# Patient Record
Sex: Female | Born: 1960 | Hispanic: No | Marital: Single | Smoking: Never smoker
Health system: Southern US, Community
[De-identification: ages and names within clinical notes are randomized; demographics above are authoritative.]

## PROBLEM LIST (undated history)

## (undated) DIAGNOSIS — E119 Type 2 diabetes mellitus without complications: Secondary | ICD-10-CM

## (undated) DIAGNOSIS — E7800 Pure hypercholesterolemia, unspecified: Secondary | ICD-10-CM

## (undated) DIAGNOSIS — I10 Essential (primary) hypertension: Secondary | ICD-10-CM

---

## 2020-05-06 ENCOUNTER — Emergency Department
Admission: EM | Admit: 2020-05-06 | Discharge: 2020-05-06 | Disposition: A | Discharge status: Home / Self Care | Payer: Medicare Other | Attending: Emergency Medicine | Admitting: Emergency Medicine

## 2020-05-06 ENCOUNTER — Encounter: Payer: Self-pay | Admitting: Intensive Care

## 2020-05-06 ENCOUNTER — Other Ambulatory Visit: Payer: Self-pay

## 2020-05-06 ENCOUNTER — Emergency Department: Payer: Medicare Other

## 2020-05-06 DIAGNOSIS — H538 Other visual disturbances: Secondary | ICD-10-CM | POA: Diagnosis not present

## 2020-05-06 DIAGNOSIS — E119 Type 2 diabetes mellitus without complications: Secondary | ICD-10-CM | POA: Insufficient documentation

## 2020-05-06 DIAGNOSIS — I10 Essential (primary) hypertension: Secondary | ICD-10-CM | POA: Insufficient documentation

## 2020-05-06 DIAGNOSIS — H539 Unspecified visual disturbance: Secondary | ICD-10-CM

## 2020-05-06 HISTORY — DX: Essential (primary) hypertension: I10

## 2020-05-06 HISTORY — DX: Type 2 diabetes mellitus without complications: E11.9

## 2020-05-06 HISTORY — DX: Pure hypercholesterolemia, unspecified: E78.00

## 2020-05-06 LAB — GLUCOSE, CAPILLARY: Glucose-Capillary: 158 mg/dL — ABNORMAL HIGH (ref 70–99)

## 2020-05-06 NOTE — ED Notes (Signed)
Snack of graham crackers, peanut butter, and milk given

## 2020-05-06 NOTE — Discharge Instructions (Signed)
Your CT scan of the head today showed some old strokes but no serious issues.  Your exam was otherwise reassuring. Please call [facility] tomorrow to see if they can see you soon.  You can also follow up with neurology for continued monitoring of your health and prevention of future strokes.

## 2020-05-06 NOTE — ED Triage Notes (Signed)
Patient c/o black line in left eye impairing her vision since Friday 05/01/20. Eye doctor reported to patient he could not see her until 05/22/20. Came to ER because black line is still present. Reports being able to see but a challenge to focus. C/o redness to right eye

## 2020-05-06 NOTE — ED Provider Notes (Signed)
[facility] Emergency Department Provider Note  ____________________________________________  Time seen: Approximately 8:31 PM  I have reviewed the triage vital signs and the nursing notes.   HISTORY  Chief Complaint Visual disturbance   HPI Anna Nichols is a 59 y.o. female with a history of hypertension hyperlipidemia diabetes and prior strokes comes the ED complaining of a visual disturbance in her left eye for the past 5 days.  It is constant, appears that there is a thin line in the middle of her visual field.  She was convinced that she had an eyelash in her hair and so she flushed her eye copiously but it did not resolve it.  Denies any recent trauma or significant headache fevers chills or neck pain.  No weakness paresthesias or change in balance or coordination.  She called her PCP who referred her to [facility] ophthalmology who told her they could not see her until October 22.  She went to [facility] walk-in who sent her to the ED for further evaluation.  Denies any complaints whatsoever except for the visual disturbance.      Past Medical History:  Diagnosis Date  . Diabetes mellitus without complication (HCC)   . High cholesterol   . Hypertension      There are no problems to display for this patient.    History reviewed. No pertinent surgical history.   Prior to Admission medications   Not on File     Allergies Ace inhibitors, Caffeine, and Codeine   History reviewed. No pertinent family history.  Social History Social History   Tobacco Use  . Smoking status: Never Smoker  . Smokeless tobacco: Never Used  Substance Use Topics  . Alcohol use: Never  . Drug use: Never    Review of Systems  Constitutional:   No fever or chills.  ENT:   No sore throat. No rhinorrhea. Cardiovascular:   No chest pain or syncope. Respiratory:   No dyspnea or cough. Gastrointestinal:   Negative for abdominal pain, vomiting and diarrhea.   Musculoskeletal:   Negative for focal pain or swelling All other systems reviewed and are negative except as documented above in ROS and HPI.  ____________________________________________   PHYSICAL EXAM:  VITAL SIGNS: ED Triage Vitals  Enc Vitals Group     BP 05/06/20 1746 (!) 150/79     Pulse Rate 05/06/20 1746 71     Resp 05/06/20 2024 16     Temp 05/06/20 1746 98.4 F (36.9 C)     Temp Source 05/06/20 1746 Oral     SpO2 05/06/20 1746 98 %     Weight 05/06/20 1747 118 lb (53.5 kg)     Height 05/06/20 1747 4\' 7"  (1.397 m)     Head Circumference --      Peak Flow --      Pain Score 05/06/20 1746 2     Pain Loc --      Pain Edu? --      Excl. in GC? --     Vital signs reviewed, nursing assessments reviewed.   Constitutional:   Alert and oriented. Non-toxic appearance. Eyes:   Conjunctivae are normal. EOMI. PERRL.  No nystagmus.  Funduscopy normal bilaterally, sharp disks, no hemorrhage or pallor ENT      Head:   Normocephalic and atraumatic.      Nose:   Normal.      Mouth/Throat:   Normal, moist mucosa      Neck:   No meningismus. Full  ROM. Hematological/Lymphatic/Immunilogical:   No cervical lymphadenopathy. Cardiovascular:   RRR. Symmetric bilateral radial and DP pulses.  No murmurs. Cap refill less than 2 seconds. Respiratory:   Normal respiratory effort without tachypnea/retractions. Breath sounds are clear and equal bilaterally. No wheezes/rales/rhonchi. Gastrointestinal:   Soft and nontender. Non distended. There is no CVA tenderness.  No rebound, rigidity, or guarding.  Musculoskeletal:   Normal range of motion in all extremities. No joint effusions.  No lower extremity tenderness.  No edema. Neurologic:   Normal speech and language.  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 Normal gait, no extremity drift.  Normal finger-to-nose bilaterally.  Normal coordination of both sides together. No acute focal neurologic deficits are  appreciated.  Skin:    Skin is warm, dry and intact. No rash noted.  No petechiae, purpura, or bullae.  ____________________________________________    LABS (pertinent positives/negatives) (all labs ordered are listed, but only abnormal results are displayed) Labs Reviewed  GLUCOSE, CAPILLARY - Abnormal; Notable for the following components:      Result Value   Glucose-Capillary 158 (*)    All other components within normal limits  CBG MONITORING, ED   ____________________________________________   EKG    ____________________________________________    RADIOLOGY  CT Head Wo Contrast  Result Date: 05/06/2020 CLINICAL DATA:  Vision loss black line in left eye impairing the vision EXAM: CT HEAD WITHOUT CONTRAST CT MAXILLOFACIAL WITHOUT CONTRAST TECHNIQUE: Multidetector CT imaging of the head and maxillofacial structures were performed using the standard protocol without intravenous contrast. Multiplanar CT image reconstructions of the maxillofacial structures were also generated. COMPARISON:  None. FINDINGS: CT HEAD FINDINGS Brain: No intracranial mass or hemorrhage is visualized. Wedge-shaped hypodensity in the left cerebellum, series 3 image 7 through 10. No midline shift. Nonenlarged ventricles. Vascular: No hyperdense vessels.  Carotid vascular calcification Skull: Normal. Negative for fracture or focal lesion. Other: None CT MAXILLOFACIAL FINDINGS Osseous: No fracture or mandibular dislocation. No destructive process. Orbits: Negative. No traumatic or inflammatory finding. Sinuses: Mild mucosal thickening in the maxillary and ethmoid sinuses. Soft tissues: Negative. IMPRESSION: 1. Wedge-shaped hypodensity in the left cerebellum, suspicious for acute to subacute infarct. Negative for hemorrhage or significant mass effect. 2. No CT evidence for acute orbital abnormality. Electronically Signed   By: Jasmine Pang M.D.   On: 05/06/2020 18:53   CT Orbits Wo Contrast  Result Date:  05/06/2020 CLINICAL DATA:  Vision loss black line in left eye impairing the vision EXAM: CT HEAD WITHOUT CONTRAST CT MAXILLOFACIAL WITHOUT CONTRAST TECHNIQUE: Multidetector CT imaging of the head and maxillofacial structures were performed using the standard protocol without intravenous contrast. Multiplanar CT image reconstructions of the maxillofacial structures were also generated. COMPARISON:  None. FINDINGS: CT HEAD FINDINGS Brain: No intracranial mass or hemorrhage is visualized. Wedge-shaped hypodensity in the left cerebellum, series 3 image 7 through 10. No midline shift. Nonenlarged ventricles. Vascular: No hyperdense vessels.  Carotid vascular calcification Skull: Normal. Negative for fracture or focal lesion. Other: None CT MAXILLOFACIAL FINDINGS Osseous: No fracture or mandibular dislocation. No destructive process. Orbits: Negative. No traumatic or inflammatory finding. Sinuses: Mild mucosal thickening in the maxillary and ethmoid sinuses. Soft tissues: Negative. IMPRESSION: 1. Wedge-shaped hypodensity in the left cerebellum, suspicious for acute to subacute infarct. Negative for hemorrhage or significant mass effect. 2. No CT evidence for acute orbital abnormality. Electronically Signed   By: Jasmine Pang M.D.   On: 05/06/2020 18:53    ____________________________________________   PROCEDURES Procedures  ____________________________________________  DIFFERENTIAL DIAGNOSIS   Intracranial hemorrhage, acute ischemic stroke, retinal detachment, photopsia  CLINICAL IMPRESSION / ASSESSMENT AND PLAN / ED COURSE  Medications ordered in the ED: Medications - No data to display  Pertinent labs & imaging results that were available during my care of the patient were reviewed by me and considered in my medical decision making (see chart for details).  Anna Nichols was evaluated in Emergency Department on 05/06/2020 for the symptoms described in the history of present illness. She was  evaluated in the context of the global COVID-19 pandemic, which necessitated consideration that the patient might be at risk for infection with the SARS-CoV-2 virus that causes COVID-19. Institutional protocols and algorithms that pertain to the evaluation of patients at risk for COVID-19 are in a state of rapid change based on information released by regulatory bodies including the CDC and federal and state organizations. These policies and algorithms were followed during the patient's care in the ED.   Patient presents with a visual disturbance.  Vital signs and exam are unremarkable.  She does not have any other symptoms to suggest an ischemic stroke.  CT of the head and orbits were obtained here which do show an area of apparent infarct in the left cerebellum.  However, comparing to records from [facility] in care everywhere, this infarct has been present for many years.  It is not acute, and based on her lack of symptoms I would doubt a an acute cerebellar infarct.  I will refer her to [facility] who most likely can see her much sooner.  Recommend she follow-up with neurology as well for continued optimization of medical management and stroke prevention     ____________________________________________   FINAL CLINICAL IMPRESSION(S) / ED DIAGNOSES    Final diagnoses:  Visual disturbance     ED Discharge Orders    None      Portions of this note were generated with dragon dictation software. Dictation errors may occur despite best attempts at proofreading.   Sharman Cheek, MD 05/06/20 2035

## 2020-06-01 ENCOUNTER — Other Ambulatory Visit: Payer: Self-pay | Admitting: Acute Care

## 2020-06-01 DIAGNOSIS — Z8673 Personal history of transient ischemic attack (TIA), and cerebral infarction without residual deficits: Secondary | ICD-10-CM

## 2020-06-11 ENCOUNTER — Other Ambulatory Visit: Payer: Self-pay

## 2020-06-11 ENCOUNTER — Ambulatory Visit
Admission: RE | Admit: 2020-06-11 | Discharge: 2020-06-11 | Disposition: A | Discharge status: Home / Self Care | Payer: Medicare Other | Source: Ambulatory Visit | Attending: Acute Care | Admitting: Acute Care

## 2020-06-11 DIAGNOSIS — Z8673 Personal history of transient ischemic attack (TIA), and cerebral infarction without residual deficits: Secondary | ICD-10-CM

## 2020-07-17 ENCOUNTER — Ambulatory Visit: Payer: Medicare Other | Attending: Neurology

## 2020-07-17 ENCOUNTER — Other Ambulatory Visit: Payer: Self-pay

## 2020-07-17 DIAGNOSIS — R262 Difficulty in walking, not elsewhere classified: Secondary | ICD-10-CM

## 2020-07-17 DIAGNOSIS — R2681 Unsteadiness on feet: Secondary | ICD-10-CM | POA: Insufficient documentation

## 2020-07-17 DIAGNOSIS — M6281 Muscle weakness (generalized): Secondary | ICD-10-CM | POA: Insufficient documentation
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
## 2020-07-21 ENCOUNTER — Ambulatory Visit: Payer: Medicare Other

## 2020-07-21 ENCOUNTER — Other Ambulatory Visit: Payer: Self-pay

## 2020-07-21 VITALS — BP 145/74 | HR 91

## 2020-07-21 DIAGNOSIS — M6281 Muscle weakness (generalized): Secondary | ICD-10-CM | POA: Diagnosis not present

## 2020-07-21 DIAGNOSIS — R262 Difficulty in walking, not elsewhere classified: Secondary | ICD-10-CM
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
## 2020-07-29 ENCOUNTER — Ambulatory Visit: Payer: Medicare Other

## 2020-07-29 VITALS — BP 124/75 | HR 81

## 2020-07-29 DIAGNOSIS — R2681 Unsteadiness on feet: Secondary | ICD-10-CM

## 2020-07-29 DIAGNOSIS — M6281 Muscle weakness (generalized): Secondary | ICD-10-CM | POA: Diagnosis not present

## 2020-07-29 DIAGNOSIS — R262 Difficulty in walking, not elsewhere classified: Secondary | ICD-10-CM

## 2020-07-29 NOTE — Patient Instructions (Signed)
Access Code: [email or web address] URL: [email or web address] Date: 07/29/2020 Prepared by: Ria Comment  Exercises Sit to Stand without Arm Support - 1 x daily - 7 x weekly - 2 sets - 10 reps Seated March - 1 x daily - 7 x weekly - 2 sets - 10 reps - 3s hold Seated Hip Adduction Isometrics with Ball - 1 x daily - 7 x weekly - 2 sets - 10 reps - 3s hold Standing Tandem Balance with Counter Support - 1 x daily - 7 x weekly - 30s x 3 with each forward hold
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
## 2020-08-03 ENCOUNTER — Ambulatory Visit: Payer: Medicare Other

## 2020-08-03 NOTE — Patient Instructions (Incomplete)
TREATMENT    Ther-ex  Hooklying LE marches x 10 BLE;  Supine L SLR x 10; Supine L straight leg hip abduction/adduction with manual resistance from therapist x 10 each; Left heel slides with manually resisted extension x 10; Hooklying clams with manual resistance from therapist x 10 BLE; Hooklying adductor squeeze with manual resistance from therapist x 10 BLE; Hooklying bridges, RLE slightly extended to bias LLE x 10; Seated marches with manual resistance from therapist x 10 BLE; Seated L LAQ with manual resistance form therapist x 10; Sit to stand from low mat table with RLE on 6" step to bias LLE, no UE assist 2 x 10;   Neuromuscular Re-education  All balance exercises performed without upper extremity support unless otherwise indicated; Alternating 6" step taps x10 BLE;  Alternating Airex 6" step taps x10 BLE; 6" step ups leading with LLE up and RLE down x10; Hurdle forward steps alternating lower extremity x10 each; Tandem gait in parallel bars 10 feet x 4 lengths; HEP issued with education about how to perform safely at home;   Pt educated throughout session about proper posture and technique with exercises. Improved exercise technique, movement at target joints, use of target muscles after min to mod verbal, visual, tactile cues.    Patient demonstrates excellent motivation during session today.  Progressed strengthening as well as balance activities.  Initiated HEP with education about how to perform safely at home.  Patient encouraged to follow-up as scheduled. Pt will benefit from PT services to address deficits in strength, balance, and mobility in order to return to full function at home.

## 2020-08-06 ENCOUNTER — Ambulatory Visit: Payer: Medicare Other | Attending: Neurology

## 2020-08-06 ENCOUNTER — Other Ambulatory Visit: Payer: Self-pay

## 2020-08-06 DIAGNOSIS — M6281 Muscle weakness (generalized): Secondary | ICD-10-CM | POA: Insufficient documentation

## 2020-08-06 DIAGNOSIS — R2681 Unsteadiness on feet: Secondary | ICD-10-CM | POA: Diagnosis present

## 2020-08-06 DIAGNOSIS — R262 Difficulty in walking, not elsewhere classified: Secondary | ICD-10-CM | POA: Diagnosis present
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
## 2020-08-11 ENCOUNTER — Other Ambulatory Visit: Payer: Self-pay

## 2020-08-11 ENCOUNTER — Ambulatory Visit: Payer: Medicare Other

## 2020-08-11 DIAGNOSIS — R2681 Unsteadiness on feet: Secondary | ICD-10-CM

## 2020-08-11 DIAGNOSIS — M6281 Muscle weakness (generalized): Secondary | ICD-10-CM

## 2020-08-11 DIAGNOSIS — R262 Difficulty in walking, not elsewhere classified: Secondary | ICD-10-CM
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
## 2020-08-13 ENCOUNTER — Other Ambulatory Visit: Payer: Self-pay

## 2020-08-13 ENCOUNTER — Ambulatory Visit: Payer: Medicare Other

## 2020-08-13 DIAGNOSIS — M6281 Muscle weakness (generalized): Secondary | ICD-10-CM | POA: Diagnosis not present

## 2020-08-13 DIAGNOSIS — R262 Difficulty in walking, not elsewhere classified: Secondary | ICD-10-CM

## 2020-08-13 DIAGNOSIS — R2681 Unsteadiness on feet: Secondary | ICD-10-CM
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
## 2020-08-18 ENCOUNTER — Other Ambulatory Visit: Payer: Self-pay

## 2020-08-18 ENCOUNTER — Ambulatory Visit: Payer: Medicare Other

## 2020-08-18 DIAGNOSIS — M6281 Muscle weakness (generalized): Secondary | ICD-10-CM | POA: Diagnosis not present

## 2020-08-18 DIAGNOSIS — R262 Difficulty in walking, not elsewhere classified: Secondary | ICD-10-CM
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
## 2020-08-19 NOTE — Patient Instructions (Incomplete)
TREATMENT      Ther-ex  NuStep L1 x 5 minutes for warm-up during history (5 minutes unbilled); Supine L SLR x 16; Supine L straight leg hip abduction/adduction with manual resistance from therapist x 10 each; Supine left ankle dorsiflexion, plantarflexion, inversion, and eversion  x15 each; Supine left short arc quads over bolster 2x10; Left heel slides 2 x 10; Hooklying adductor squeeze with ball 3sec hold  x 10 BLE; Hooklying bridges, RLE slightly extended to bias LLE 2 x 10; Seated marches with manual resistance from therapist x 10 BLE;     Manual Therapy  MHP applied to L shoulder x 5 minutes during interval history; Gentle left shoulder passive range of motion into flexion, abduction, and external rotation; L shoulder GH A/P mobilizations at neutral grade I-II, 20s/bout x 3 bouts; L shoulder GH inferior mobilizations at 80 abduction grade I-II, 20s/bout x 2 bouts; L shoulder distraction mobilizations gradually progressing through partial abduction PROM; "Floppy fish" L shoulder rhythmic relaxation x 60s;  Ice pack applied to left shoulder x10 minutes during left lower extremity exercises;    Pt will benefit from PT services to address deficits in strength, balance, and mobility in order to return to full function at home   Patient demonstrates excellent motivation during session today.   Initiated light passive range of motion of left shoulder as well as passive accessory mobilizations for pain modulation and improved capsular mobility. Continued LLE strengthening exercises during session as well.  No HEP modifications today. Patient encouraged to follow-up as scheduled. Pt will benefit from PT services to address deficits in strength, balance, and mobility in order to return to full function at home.

## 2020-08-20 ENCOUNTER — Ambulatory Visit: Payer: Medicare Other

## 2020-08-20 ENCOUNTER — Other Ambulatory Visit: Payer: Self-pay

## 2020-08-20 DIAGNOSIS — M6281 Muscle weakness (generalized): Secondary | ICD-10-CM | POA: Diagnosis not present

## 2020-08-20 DIAGNOSIS — R262 Difficulty in walking, not elsewhere classified: Secondary | ICD-10-CM
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
## 2020-08-25 ENCOUNTER — Ambulatory Visit: Payer: Medicare Other

## 2020-08-25 ENCOUNTER — Other Ambulatory Visit: Payer: Self-pay

## 2020-08-25 DIAGNOSIS — M6281 Muscle weakness (generalized): Secondary | ICD-10-CM

## 2020-08-25 DIAGNOSIS — R262 Difficulty in walking, not elsewhere classified: Secondary | ICD-10-CM
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
## 2020-08-27 ENCOUNTER — Ambulatory Visit: Payer: Medicare Other

## 2020-08-28 ENCOUNTER — Ambulatory Visit: Payer: Medicare Other

## 2020-08-28 ENCOUNTER — Other Ambulatory Visit: Payer: Self-pay

## 2020-08-28 VITALS — BP 141/82 | HR 93

## 2020-08-28 DIAGNOSIS — M6281 Muscle weakness (generalized): Secondary | ICD-10-CM | POA: Diagnosis not present

## 2020-08-28 DIAGNOSIS — R2681 Unsteadiness on feet: Secondary | ICD-10-CM

## 2020-08-28 DIAGNOSIS — R262 Difficulty in walking, not elsewhere classified: Secondary | ICD-10-CM
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
## 2020-09-01 ENCOUNTER — Other Ambulatory Visit: Payer: Self-pay

## 2020-09-01 ENCOUNTER — Ambulatory Visit: Payer: Medicare Other | Attending: Neurology

## 2020-09-01 DIAGNOSIS — R2681 Unsteadiness on feet: Secondary | ICD-10-CM | POA: Diagnosis present

## 2020-09-01 DIAGNOSIS — M25612 Stiffness of left shoulder, not elsewhere classified: Secondary | ICD-10-CM | POA: Insufficient documentation

## 2020-09-01 DIAGNOSIS — M6281 Muscle weakness (generalized): Secondary | ICD-10-CM | POA: Insufficient documentation

## 2020-09-01 DIAGNOSIS — R262 Difficulty in walking, not elsewhere classified: Secondary | ICD-10-CM | POA: Diagnosis present
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
## 2020-09-03 ENCOUNTER — Ambulatory Visit: Payer: Medicare Other

## 2020-09-03 ENCOUNTER — Other Ambulatory Visit: Payer: Self-pay

## 2020-09-03 DIAGNOSIS — M6281 Muscle weakness (generalized): Secondary | ICD-10-CM | POA: Diagnosis not present

## 2020-09-03 DIAGNOSIS — R2681 Unsteadiness on feet: Secondary | ICD-10-CM

## 2020-09-03 DIAGNOSIS — R262 Difficulty in walking, not elsewhere classified: Secondary | ICD-10-CM
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
## 2020-09-04 ENCOUNTER — Ambulatory Visit: Payer: Medicare Other

## 2020-09-07 ENCOUNTER — Ambulatory Visit: Payer: Medicare Other

## 2020-09-08 ENCOUNTER — Ambulatory Visit: Payer: Medicare Other

## 2020-09-08 ENCOUNTER — Other Ambulatory Visit: Payer: Self-pay

## 2020-09-08 DIAGNOSIS — R262 Difficulty in walking, not elsewhere classified: Secondary | ICD-10-CM

## 2020-09-08 DIAGNOSIS — M6281 Muscle weakness (generalized): Secondary | ICD-10-CM | POA: Diagnosis not present

## 2020-09-08 DIAGNOSIS — M25612 Stiffness of left shoulder, not elsewhere classified: Secondary | ICD-10-CM
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
## 2020-09-10 ENCOUNTER — Other Ambulatory Visit: Payer: Self-pay

## 2020-09-10 ENCOUNTER — Ambulatory Visit: Payer: Medicare Other

## 2020-09-10 DIAGNOSIS — M25612 Stiffness of left shoulder, not elsewhere classified: Secondary | ICD-10-CM

## 2020-09-10 DIAGNOSIS — M6281 Muscle weakness (generalized): Secondary | ICD-10-CM

## 2020-09-10 DIAGNOSIS — R2681 Unsteadiness on feet: Secondary | ICD-10-CM

## 2020-09-10 DIAGNOSIS — R262 Difficulty in walking, not elsewhere classified: Secondary | ICD-10-CM
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
## 2020-09-15 ENCOUNTER — Other Ambulatory Visit: Payer: Self-pay

## 2020-09-15 ENCOUNTER — Ambulatory Visit: Payer: Medicare Other

## 2020-09-15 DIAGNOSIS — M25612 Stiffness of left shoulder, not elsewhere classified: Secondary | ICD-10-CM

## 2020-09-15 DIAGNOSIS — M6281 Muscle weakness (generalized): Secondary | ICD-10-CM | POA: Diagnosis not present

## 2020-09-15 DIAGNOSIS — R2681 Unsteadiness on feet: Secondary | ICD-10-CM

## 2020-09-15 DIAGNOSIS — R262 Difficulty in walking, not elsewhere classified: Secondary | ICD-10-CM
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
## 2020-09-17 ENCOUNTER — Other Ambulatory Visit: Payer: Self-pay

## 2020-09-17 ENCOUNTER — Ambulatory Visit: Payer: Medicare Other | Admitting: Physical Therapy

## 2020-09-17 DIAGNOSIS — R262 Difficulty in walking, not elsewhere classified: Secondary | ICD-10-CM

## 2020-09-17 DIAGNOSIS — M6281 Muscle weakness (generalized): Secondary | ICD-10-CM

## 2020-09-17 DIAGNOSIS — R2681 Unsteadiness on feet: Secondary | ICD-10-CM

## 2020-09-17 DIAGNOSIS — M25612 Stiffness of left shoulder, not elsewhere classified: Secondary | ICD-10-CM
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
## 2020-09-22 ENCOUNTER — Other Ambulatory Visit: Payer: Self-pay

## 2020-09-22 ENCOUNTER — Ambulatory Visit: Payer: Medicare Other

## 2020-09-22 DIAGNOSIS — R262 Difficulty in walking, not elsewhere classified: Secondary | ICD-10-CM

## 2020-09-22 DIAGNOSIS — R2681 Unsteadiness on feet: Secondary | ICD-10-CM

## 2020-09-22 DIAGNOSIS — M25612 Stiffness of left shoulder, not elsewhere classified: Secondary | ICD-10-CM

## 2020-09-22 DIAGNOSIS — M6281 Muscle weakness (generalized): Secondary | ICD-10-CM
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
## 2020-09-24 ENCOUNTER — Ambulatory Visit: Payer: Medicare Other

## 2020-09-24 ENCOUNTER — Other Ambulatory Visit: Payer: Self-pay

## 2020-09-24 DIAGNOSIS — M25612 Stiffness of left shoulder, not elsewhere classified: Secondary | ICD-10-CM

## 2020-09-24 DIAGNOSIS — M6281 Muscle weakness (generalized): Secondary | ICD-10-CM

## 2020-09-24 DIAGNOSIS — R262 Difficulty in walking, not elsewhere classified: Secondary | ICD-10-CM

## 2020-09-24 DIAGNOSIS — R2681 Unsteadiness on feet: Secondary | ICD-10-CM
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
## 2020-09-29 ENCOUNTER — Ambulatory Visit: Payer: Medicare Other | Attending: Neurology

## 2020-09-29 ENCOUNTER — Other Ambulatory Visit: Payer: Self-pay

## 2020-09-29 DIAGNOSIS — M6281 Muscle weakness (generalized): Secondary | ICD-10-CM

## 2020-09-29 DIAGNOSIS — M25612 Stiffness of left shoulder, not elsewhere classified: Secondary | ICD-10-CM | POA: Diagnosis present

## 2020-09-29 DIAGNOSIS — R262 Difficulty in walking, not elsewhere classified: Secondary | ICD-10-CM | POA: Diagnosis present

## 2020-09-29 DIAGNOSIS — R2681 Unsteadiness on feet: Secondary | ICD-10-CM | POA: Diagnosis present
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
## 2020-10-01 ENCOUNTER — Other Ambulatory Visit: Payer: Self-pay

## 2020-10-01 ENCOUNTER — Ambulatory Visit: Payer: Medicare Other

## 2020-10-01 DIAGNOSIS — M25612 Stiffness of left shoulder, not elsewhere classified: Secondary | ICD-10-CM

## 2020-10-01 DIAGNOSIS — M6281 Muscle weakness (generalized): Secondary | ICD-10-CM

## 2020-10-01 DIAGNOSIS — R262 Difficulty in walking, not elsewhere classified: Secondary | ICD-10-CM

## 2020-10-01 DIAGNOSIS — R2681 Unsteadiness on feet: Secondary | ICD-10-CM
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
## 2020-10-05 ENCOUNTER — Ambulatory Visit: Payer: Medicare Other

## 2020-10-05 ENCOUNTER — Other Ambulatory Visit: Payer: Self-pay

## 2020-10-05 DIAGNOSIS — R2681 Unsteadiness on feet: Secondary | ICD-10-CM

## 2020-10-05 DIAGNOSIS — M6281 Muscle weakness (generalized): Secondary | ICD-10-CM | POA: Diagnosis not present
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
## 2020-10-08 NOTE — Patient Instructions (Incomplete)
Re certification, no need to update goals  TREATMENT   Ther-ex NuStep L0-1 x 5 minutes for warm-up during history (4 minutes unbilled); Supine L straight leg hip abduction/adduction with manual resistance from therapist x 10 each; Left heel slideswith resisted extensionx 20; Hooklying L single leg bridge2 x 10; Hooklying clams with manual resistance from therapist x 20; Hooklying adductor squeezes with manual resistance from therapist x 20; Sit to stand without UE support from mat table with RLE on 6" step 2 x 10; Standing squats with butt tap on table x 10, verbal assist by therapist to keep weight evenly distributed between R and L feet; 6" forward step-up leading with LLE up and RLE down x 10; 6" L lateral step-up leading with LLE up and RLE down x 10;   Manual Therapy Gentle left shoulder passive range of motion into flexion, abduction, and external rotation; L shoulder GH A/P mobilizations at neutral grade I-II, 20s/bout x 3 bouts; L shoulder GH inferior mobilizations at 90 abduction grade I-II, 20s/bout x 2 bouts; L shoulder distraction mobilizations gradually progressing through partial abduction PROM; L shoulder posterior and inferior mobilizations at available end range flexion, grade I-II, 20s/bout x 3 bouts;   Pt educated throughout session about proper posture and technique with exercises. Improved exercise technique, movement at target joints, use of target muscles after min to mod verbal, visual, tactile cues.    Patient demonstrates excellent motivation during session today. Continued withlight passive range of motion of left shoulder as well as passive accessory mobilizations for pain modulation and improvedcapsular mobility. She reports that her left upper extremity numbness has gotten significantly better but she does report some burning in the left lateral part of her upper arm.. Continued LLEstrengtheningexercises during sessionas well.  NoHEPmodifications today.Patient encouraged to follow-up as scheduled. Pt will benefit from PT services to address deficits in strength, balance, and mobility in order to return to full function at home.

## 2020-10-09 ENCOUNTER — Other Ambulatory Visit: Payer: Self-pay

## 2020-10-09 ENCOUNTER — Ambulatory Visit: Payer: Medicare Other

## 2020-10-09 DIAGNOSIS — R2681 Unsteadiness on feet: Secondary | ICD-10-CM

## 2020-10-09 DIAGNOSIS — M6281 Muscle weakness (generalized): Secondary | ICD-10-CM
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
## 2020-10-13 ENCOUNTER — Ambulatory Visit: Payer: Medicare Other

## 2020-10-21 NOTE — Patient Instructions (Incomplete)
TREATMENT   Ther-ex NuStep L0-1 x 5 minutes for warm-up during history (4 minutes unbilled); Supine L straight leg hip abduction/adduction with manual resistance from therapist x 20; Left heel slideswith resisted extensionx 20; Hooklying L single leg bridge2x 10; Hooklying clams with manual resistance from therapist x 20; Hooklying adductor squeezes with manual resistance from therapist x 20; Reviewed mobility concerns with patient for international travel;   Manual Therapy Gentle left shoulder passive range of motion into flexion, abduction, and external rotation; L shoulder GH A/P mobilizations at neutral grade I-II, 20s/bout x 3 bouts; L shoulder GH inferior mobilizations at 90 abduction grade I-II, 20s/bout x 2 bouts; L shoulder distraction mobilizations gradually progressing through partial abduction PROM; L shoulder posterior and inferior mobilizations at available end range flexion, grade I-II, 20s/bout x 3 bouts;   Pt educated throughout session about proper posture and technique with exercises. Improved exercise technique, movement at target joints, use of target muscles after min to mod verbal, visual, tactile cues.   Pt arrived today with excellent motivation to participate in PT. Goals and outcome measures were updated recently so they did not need to be repeated today. During the last update the pt did very well with the 5TSTS demonstrating a clinically significant improvement. Additionally, the pt demonstrated self perceived improvement as seen in her ABC and QuickDash outcome measures. She continues to struggle with shoulder flexion and abduction on the L side when compared to full range on the R. Additionally, pt can still make improvements in her with continued PT services. Today's session focused on L shoulder manual therapy as well as LLE strengthening. Overall, the pt has excellent motivation to continue making progress during further treatment. Pt  will benefit from PT services to address deficits in strength, balance, and mobility in order to return to full function at home

## 2020-10-22 ENCOUNTER — Other Ambulatory Visit: Payer: Self-pay

## 2020-10-22 ENCOUNTER — Ambulatory Visit: Payer: Medicare Other

## 2020-10-22 DIAGNOSIS — M6281 Muscle weakness (generalized): Secondary | ICD-10-CM

## 2020-10-22 DIAGNOSIS — R2681 Unsteadiness on feet: Secondary | ICD-10-CM
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
## 2020-10-26 NOTE — Patient Instructions (Incomplete)
TREATMENT   Ther-ex NuStep L0-1 x 5 minutes for warm-up during history (4 minutes unbilled); Supine L straight leg hip abduction/adduction with manual resistance from therapist x20; Left heel slideswith resisted extensionx 20; Hooklying double leg bridge 2 x 10; Hooklying L single leg bridge2x 10; Hooklying clams with manual resistance from therapist x 20; Hooklying adductor squeezes with manual resistance from therapist x 20; L shoulder serratus punch manual resistance from therapist x10; L shoulder supine flexion with light manual resistance from therapist x10; L rhythmic stabilization x30 seconds;   Manual Therapy Gentle left shoulder passive range of motion into flexion, abduction, and external rotation; L shoulder GH A/P mobilizations at neutral grade I-II, 20s/bout x 3 bouts; L shoulder GH inferior mobilizations at 90 abduction grade I-II, 20s/bout x 2 bouts; L shoulder distraction mobilizations gradually progressing through partial abduction PROM; L shoulder posterior and inferior mobilizations at available end range flexion, grade I-II, 20s/bout x 3 bouts; STM to L anterior, lateral, and posterior shoulder with patient in supine utilizing effleurage and petrissage techniques to decrease pain;   Pt educated throughout session about proper posture and technique with exercises. Improved exercise technique, movement at target joints, use of target muscles after min to mod verbal, visual, tactile cues.   Pt arrived today with excellent motivation to participate in PT.  Some additional time spent on left shoulder manual techniques and strengthening due to her reported increase in pain since her last therapy session.  Also continued with left lower extremity strengthening today.  Patient reports decrease in her left shoulder pain with motion at the end of the session.  Pt will benefit from PT services to address deficits in strength, balance, and mobility in order to  return to full function at home.

## 2020-10-27 ENCOUNTER — Other Ambulatory Visit: Payer: Self-pay

## 2020-10-27 ENCOUNTER — Ambulatory Visit: Payer: Medicare Other

## 2020-10-27 DIAGNOSIS — M6281 Muscle weakness (generalized): Secondary | ICD-10-CM | POA: Diagnosis not present

## 2020-10-27 DIAGNOSIS — R2681 Unsteadiness on feet: Secondary | ICD-10-CM
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
## 2020-10-29 NOTE — Patient Instructions (Incomplete)
TREATMENT   Ther-ex NuStep L0-1 x 5 minutes for warm-up during history (4 minutes unbilled); Supine L straight leg hip abduction/adduction with manual resistance from therapist x20; Left heel slideswith resisted extensionx 20; Hooklying double leg bridge x 20; Hooklying L single leg bridgex 20; Hooklying clams with manual resistance from therapist x 20; Hooklying adductor squeezes with manual resistance from therapist x 20; Forward 6 inch step ups leading with left lower extremity x10; Left lateral 6 inch step ups leading with left lower extremity x10; Stands without upper extremity support with right lower extremity elevated on 6 inch step x10;   Manual Therapy Gentle left shoulder passive range of motion into flexion, abduction, and external rotation; L shoulder GH A/P mobilizations at neutral grade I-II, 20s/bout x 3 bouts; L shoulder GH inferior mobilizations at 90 abduction grade I-II, 20s/bout x 2 bouts; L shoulder distraction mobilizations gradually progressing through partial abduction PROM; L shoulder posterior and inferior mobilizations at available end range flexion, grade I-II, 20s/bout x 3 bouts; STM to L anterior, lateral, and posterior shoulder with patient in supine utilizing petrissage techniques to decrease pain;   Pt educated throughout session about proper posture and technique with exercises. Improved exercise technique, movement at target joints, use of target muscles after min to mod verbal, visual, tactile cues.   Pt arrived today with excellent motivation to participate in PT. Continued with left lower extremity strengthening today as well as manual techniques for left shoulder pain.  Patient reports decrease in her left shoulder pain with motion at the end of the session.  Incorporated additional strengthening exercises in standing including step ups and sit to stands.  Will continue to progress standing strengthening exercises as patient is able  to perform.  Pt will benefit from PT services to address deficits in strength, balance, and mobility in order to return to full function at home.

## 2020-10-30 ENCOUNTER — Ambulatory Visit: Payer: Medicare Other | Attending: Neurology

## 2020-10-30 ENCOUNTER — Other Ambulatory Visit: Payer: Self-pay

## 2020-10-30 DIAGNOSIS — M25612 Stiffness of left shoulder, not elsewhere classified: Secondary | ICD-10-CM | POA: Diagnosis present

## 2020-10-30 DIAGNOSIS — R2681 Unsteadiness on feet: Secondary | ICD-10-CM | POA: Diagnosis present

## 2020-10-30 DIAGNOSIS — M6281 Muscle weakness (generalized): Secondary | ICD-10-CM
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
## 2020-11-02 NOTE — Patient Instructions (Incomplete)
TREATMENT   Ther-ex NuStep L0-1 x 5 minutes for warm-up during history (4 minutes unbilled); Supine L SLR x20; Supine L straight leg hip abduction/adduction with manual resistance from therapist x20; Left heel slideswith resisted extensionx 20; Hooklying L single leg bridgex20; Hooklying clams with manual resistance from therapist x 20; Hooklying adductor squeezes with manual resistance from therapist x 20; Supine L shoulder serratus punches with manual resistance from therapist x 10; Supine L shoulder rhythmic stabilization with shoulder flexed to 90 degrees x 30s; Supine L shoulder ER with manual resistance from therapist x 10; Supine L shoulder IR with manual resistance from therapist x 10; Seated rows with green tband x 10; Sit to stands without upper extremity support with right lower extremity elevated on 6 inch step x10;   Manual Therapy Gentle left shoulder passive range of motion into flexion, abduction, and external rotation; L shoulder GH A/P mobilizations at neutral grade I-II, 20s/bout x 3 bouts; L shoulder GH inferior mobilizations at 90 abduction grade I-II, 20s/bout x 2 bouts; L shoulder distraction mobilizations gradually progressing through partial abduction PROM; L shoulder posterior and inferior mobilizations at available end range flexion, grade I-II, 20s/bout x 3 bouts; STM to L anterior, lateral, and posterior shoulder with patient in supine utilizing petrissage techniques to decrease pain;   Pt educated throughout session about proper posture and technique with exercises. Improved exercise technique, movement at target joints, use of target muscles after min to mod verbal, visual, tactile cues.   Pt arrived today with excellent motivation to participate in PT.Her shoulder was sore upon arrival so continued with L shoulder manual techniques as well as L shoulder strengthening. Continued with left lower extremity strengthening today. Patient  reports decrease in her left shoulder pain with motion at the end of the session. Will continue to progress standing strengthening exercisesaspatient is able to perform. Pt will benefit from PT services to address deficits in strength, balance, and mobility in order to return to full function at home.

## 2020-11-03 ENCOUNTER — Ambulatory Visit: Payer: Medicare Other

## 2020-11-03 ENCOUNTER — Other Ambulatory Visit: Payer: Self-pay

## 2020-11-03 DIAGNOSIS — M6281 Muscle weakness (generalized): Secondary | ICD-10-CM | POA: Diagnosis not present

## 2020-11-03 DIAGNOSIS — R2681 Unsteadiness on feet: Secondary | ICD-10-CM
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
## 2020-11-05 NOTE — Patient Instructions (Incomplete)
TREATMENT   Ther-ex NuStep L0-1 x 5 minutes for warm-up during history (3 minutes unbilled); Supine LSLRx20; Supine L straight leg hip abduction/adduction with manual resistance from therapist x20 each; Left heel slideswith resisted extensionx 20; Hooklying L single leg bridgex20; Hooklying clams with manual resistance from therapist x 20; Hooklying adductor squeezes with manual resistance from therapist x 20; Supine L ankle manually resisted plantarflexion, dorsiflexion, inversion and eversion x 10 each; Sit to stands without upper extremity support with right lower extremity elevated on 6 inch step x10; Alternating 6" step taps with and without Airex pad x 10 each; 6" forward and L lateral step-ups x 10 each direction;   Manual Therapy Gentle left shoulder passive range of motion into flexion, abduction, and external rotation; L shoulder GH A/P mobilizations at neutral grade I-II, 20s/bout x 3 bouts; L shoulder GH inferior mobilizations at 90 abduction grade I-II, 20s/bout x 2 bouts; L shoulder distraction mobilizations gradually progressing through partial abduction PROM; L shoulder posterior and inferior mobilizations at available end range flexion, grade I-II, 20s/bout x 3 bouts;   Pt educated throughout session about proper posture and technique with exercises. Improved exercise technique, movement at target joints, use of target muscles after min to mod verbal, visual, tactile cues.   Pt arrived today with excellent motivation to participate in PT. Continued with L shoulder manual techniques today to improve pain between sessions. Also reintroduced L ankle strengthening and progressed to more standing exercises again today. Will continue to progress standing strengthening exercisesaspatient is able to perform. Pt will benefit from PT services to address deficits in strength, balance, and mobility in order to return to full function at home.

## 2020-11-06 ENCOUNTER — Ambulatory Visit: Payer: Medicare Other

## 2020-11-06 ENCOUNTER — Other Ambulatory Visit: Payer: Self-pay

## 2020-11-06 DIAGNOSIS — M6281 Muscle weakness (generalized): Secondary | ICD-10-CM

## 2020-11-06 DIAGNOSIS — R2681 Unsteadiness on feet: Secondary | ICD-10-CM
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
## 2020-11-11 NOTE — Patient Instructions (Incomplete)
TREATMENT   Ther-ex NuStep L1 x 5 minutes for warm-up during history ( unbilled); Seated LAQ with 3# ankle weights 2 x 1 minute; Seated clams with red tband 2 x 1 minute; Seated adductor ball squeezes 2 x 1 minute;  6" alternating step-ups with 3# ankle weights, no UE support x 10 BLE;  Standing exercises with 3# ankle weights: Hip flexion marches x 1 minute; Hip abduction x 1 minute; HS curls x 1 minute; Hip extension x 1 minute;  Supine LSLRx20; Hooklying L single leg bridgex10;   Manual Therapy Gentle left shoulder passive range of motion into flexion, abduction, and external rotation; L shoulder GH A/P mobilizations at neutral grade I-II, 20s/bout x 3 bouts; L shoulder GH inferior mobilizations at 90 abduction grade I-II, 20s/bout x 2 bouts; L shoulder distraction mobilizations gradually progressing through partial abduction PROM; L shoulder posterior and inferior mobilizations at available end range flexion, grade I-II, 20s/bout x 3 bouts;   Pt educated throughout session about proper posture and technique with exercises. Improved exercise technique, movement at target joints, use of target muscles after min to mod verbal, visual, tactile cues.   Pt arrived today with excellent motivation to participate in PT.Continued with L shoulder manual techniquestoday to improve pain between sessions. Also continued with strengthening and progressed to additional seated and standing exercises today.Will continue to progress standing strengthening exercisesaspatient is able to perform to improve function at home and work. Pt will benefit from PT services to address deficits in strength, balance, and mobility in order to return to full function at home.

## 2020-11-12 ENCOUNTER — Ambulatory Visit: Payer: Medicare Other

## 2020-11-12 ENCOUNTER — Other Ambulatory Visit: Payer: Self-pay

## 2020-11-12 DIAGNOSIS — M6281 Muscle weakness (generalized): Secondary | ICD-10-CM

## 2020-11-12 DIAGNOSIS — R2681 Unsteadiness on feet: Secondary | ICD-10-CM
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
## 2020-11-16 NOTE — Patient Instructions (Incomplete)
TREATMENT   Ther-ex NuStep L2 x 5 minutes for warm-up during history ( unbilled); Forward and L lateral step-ups to 6" step with Airex pad on top x 10 each, no UE support; 12" forward step-ups leading with LLE and no UE support x 10; Airex balance beam tandem gait stepping over 12" hurdles 6' x 4 length; Supine [id number]; Supine L straight leg hip abduction/adduction with manual resistance from therapist x20 each; Left heel slideswith resisted extensionx 20; Hooklying L single leg bridgex20; Hooklying clams with manual resistance from therapist x 20; Hooklying adductor squeezes with manual resistance from therapist x 20;   Manual Therapy Gentle left shoulder passive range of motion into flexion, abduction, and external rotation; L shoulder GH A/P mobilizations at neutral grade I-II, 20s/bout x 2 bouts; L shoulder GH inferior mobilizations at 90 abduction grade I-II, 20s/bout x 2 bouts; L shoulder distraction mobilizations gradually progressing through partial abduction PROM; L shoulder posterior and inferior mobilizations at available end range flexion, grade I-II, 20s/bout x 2 bouts; L shoulder GH A/P mobilizations at 90 abduction and available end range ER grade I-II, 20s/bout x 2 bouts;   Pt educated throughout session about proper posture and technique with exercises. Improved exercise technique, movement at target joints, use of target muscles after min to mod verbal, visual, tactile cues.   Pt arrived today with excellent motivation to participate in PT.Continued with L shoulder manual techniquestoday to improve pain as her shoulder has been bothering her more over the last couple visits.Continued with strengthening in standing as well as supine for isolation.Will continue to progress standing strengthening exercisesaspatient is able to performto improve function at home and work. Overall she is reporting significant improvement in her strength and  endurance as well as decrease in her L shoulder pain with therapy. Pt will benefit from PT services to address deficits in strength, balance, and mobility in order to return to full function at home.

## 2020-11-17 ENCOUNTER — Other Ambulatory Visit: Payer: Self-pay

## 2020-11-17 ENCOUNTER — Ambulatory Visit: Payer: Medicare Other

## 2020-11-17 DIAGNOSIS — R2681 Unsteadiness on feet: Secondary | ICD-10-CM

## 2020-11-17 DIAGNOSIS — M6281 Muscle weakness (generalized): Secondary | ICD-10-CM
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
## 2020-11-19 ENCOUNTER — Ambulatory Visit: Payer: Medicare Other

## 2020-11-19 ENCOUNTER — Other Ambulatory Visit: Payer: Self-pay

## 2020-11-19 DIAGNOSIS — M6281 Muscle weakness (generalized): Secondary | ICD-10-CM | POA: Diagnosis not present

## 2020-11-19 DIAGNOSIS — R2681 Unsteadiness on feet: Secondary | ICD-10-CM
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
## 2020-11-23 ENCOUNTER — Ambulatory Visit: Payer: Medicare Other

## 2020-11-24 NOTE — Patient Instructions (Signed)
TREATMENT   Ther-ex NuStep L2x 10 minutes for warm-up during history (5minutes unbilled); Total Gym L14 double leg squats 2 x 10;  Standing exercises with 2# ankle weights (AW): Hip flexion marches x 1 minute; Hip abduction x 1 minute; HS curls x 1 minute; Hip extension x 1 minute;  Rest breaks between exercises due to fatigue;   Neuromuscular Re-education  All exercises performed in // bars without UE support unless otherwise specified:  6" hurdle steps forward and lateral with 2# AW x 6 lengths each; Airex balance beam tandem gait and side stepping x 6 lengths each;   Manual Therapy Gentle left shoulder passive range of motion into flexion, abduction, and external rotation; L shoulder GH A/P mobilizations at neutral grade I-II, 20s/bout x2bouts; L shoulder GH inferior mobilizations at 90 abduction grade I-II, 20s/bout x 2 bouts; L shoulder distraction mobilizations gradually progressing through partial abduction PROM; L shoulder posterior and inferior mobilizations at available end range flexion, grade I-II, 20s/bout x2bouts; L shoulder GH A/P mobilizations at90 abduction and available end range ERgrade I-II, 20s/bout x 2bouts;   Pt educated throughout session about proper posture and technique with exercises. Improved exercise technique, movement at target joints, use of target muscles after min to mod verbal, visual, tactile cues.   Pt arrived today with excellent motivation to participate in PT.Continued with L shoulder manual techniquestoday to improve pain.Continued with strengtheningwith additional exercises performed in standing. Also included balance exercises during session today.Will continue to progress standing strengthening exercisesaspatient is able to performto improve function at home and work.Overall she is reporting significant improvement in her strength and endurance as well as decrease in her L shoulder pain with  therapy.She will need updated outcome measures and goals at next therapy session as part of a progress note. Pt will benefit from PT services to address deficits in strength, balance, and mobility in order to return to full function at home. 

## 2020-11-25 ENCOUNTER — Other Ambulatory Visit: Payer: Self-pay

## 2020-11-25 ENCOUNTER — Encounter: Payer: Self-pay | Admitting: Physical Therapy

## 2020-11-25 ENCOUNTER — Ambulatory Visit: Payer: Medicare Other | Admitting: Physical Therapy

## 2020-11-25 DIAGNOSIS — R2681 Unsteadiness on feet: Secondary | ICD-10-CM

## 2020-11-25 DIAGNOSIS — M6281 Muscle weakness (generalized): Secondary | ICD-10-CM
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
## 2020-11-30 NOTE — Patient Instructions (Incomplete)
TREATMENT   Ther-ex NuStep L2x 10 minutes for warm-up during history ( unbilled); Total Gym L14 double leg squats 2 x 10;  Standing exercises with 2# ankle weights (AW): Hip flexion marches x 1 minute; Hip abduction x 1 minute; HS curls x 1 minute; Hip extension x 1 minute;  Rest breaks between exercises due to fatigue;   Neuromuscular Re-education  All exercises performed in // bars without UE support unless otherwise specified:  6" hurdle steps forward and lateral with 2# AW x 6 lengths each; Airex balance beam tandem gait and side stepping x 6 lengths each;   Manual Therapy Gentle left shoulder passive range of motion into flexion, abduction, and external rotation; L shoulder GH A/P mobilizations at neutral grade I-II, 20s/bout x2bouts; L shoulder GH inferior mobilizations at 90 abduction grade I-II, 20s/bout x 2 bouts; L shoulder distraction mobilizations gradually progressing through partial abduction PROM; L shoulder posterior and inferior mobilizations at available end range flexion, grade I-II, 20s/bout x2bouts; L shoulder GH A/P mobilizations at90 abduction and available end range ERgrade I-II, 20s/bout x 2bouts;   Pt educated throughout session about proper posture and technique with exercises. Improved exercise technique, movement at target joints, use of target muscles after min to mod verbal, visual, tactile cues.   Pt arrived today with excellent motivation to participate in PT.Continued with L shoulder manual techniquestoday to improve pain.Continued with strengtheningwith additional exercises performed in standing. Also included balance exercises during session today.Will continue to progress standing strengthening exercisesaspatient is able to performto improve function at home and work.Overall she is reporting significant improvement in her strength and endurance as well as decrease in her L shoulder pain with  therapy.She will need updated outcome measures and goals at next therapy session as part of a progress note. Pt will benefit from PT services to address deficits in strength, balance, and mobility in order to return to full function at home.

## 2020-12-01 ENCOUNTER — Ambulatory Visit: Payer: Medicare Other | Attending: Neurology

## 2020-12-01 ENCOUNTER — Other Ambulatory Visit: Payer: Self-pay

## 2020-12-01 DIAGNOSIS — R2681 Unsteadiness on feet: Secondary | ICD-10-CM | POA: Insufficient documentation

## 2020-12-01 DIAGNOSIS — M6281 Muscle weakness (generalized): Secondary | ICD-10-CM | POA: Diagnosis present

## 2020-12-01 DIAGNOSIS — M25612 Stiffness of left shoulder, not elsewhere classified: Secondary | ICD-10-CM | POA: Insufficient documentation
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
## 2020-12-08 ENCOUNTER — Ambulatory Visit: Payer: Medicare Other

## 2020-12-08 ENCOUNTER — Other Ambulatory Visit: Payer: Self-pay

## 2020-12-08 DIAGNOSIS — M6281 Muscle weakness (generalized): Secondary | ICD-10-CM | POA: Diagnosis not present

## 2020-12-08 DIAGNOSIS — M25612 Stiffness of left shoulder, not elsewhere classified: Secondary | ICD-10-CM

## 2020-12-08 DIAGNOSIS — R2681 Unsteadiness on feet: Secondary | ICD-10-CM

## 2020-12-08 NOTE — Patient Instructions (Signed)
Access Code: [email or web address] URL: [email or web address] Date: 12/08/2020 Prepared by: Ria Comment  Exercises Sit to Stand without Arm Support - 1 x daily - 7 x weekly - 2 sets - 10 reps Seated March - 1 x daily - 7 x weekly - 2 sets - 10 reps - 3s hold Seated Hip Adduction Isometrics with Ball - 1 x daily - 7 x weekly - 2 sets - 10 reps - 3s hold Standing Tandem Balance with Counter Support - 1 x daily - 7 x weekly - 30s x 3 with each forward hold Seated Gaze Stabilization with Head Rotation - 3 x daily - 7 x weekly - 3 reps - 30s hold
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
## 2020-12-15 ENCOUNTER — Other Ambulatory Visit: Payer: Self-pay

## 2020-12-15 ENCOUNTER — Ambulatory Visit: Payer: Medicare Other

## 2020-12-15 DIAGNOSIS — M6281 Muscle weakness (generalized): Secondary | ICD-10-CM

## 2020-12-15 DIAGNOSIS — R2681 Unsteadiness on feet: Secondary | ICD-10-CM
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
## 2020-12-16 ENCOUNTER — Ambulatory Visit: Payer: Medicare Other

## 2020-12-21 NOTE — Patient Instructions (Incomplete)
TREATMENT   Neuromuscular Re-education  Seated VOR x 1 horizontal x 60s, pt reports increase in dizziness and due to need to drive after session deferred additional reps; Gait in hallway with horizontal ball tosses to therapist with head/eye follow x 75' each direction; Gait in hallway with vertical ball tosses to self with head/eye follow 2 x 75'; Gait in hallway with head turns on command to read letters on wall 2 x 75'; Airex alternating 6" cone taps without UE support x 10 each; Airex NBOS eyes open/closed x 30s each; Airex NBOS eyes open with horizontal and vertical head turns x 30s each; 6" step-ups with Airex pad on top alternating leading LE x 10 on each side;   Manual Therapy Gentle left shoulder passive range of motion into flexion, abduction, and external rotation; L shoulder GH A/P mobilizations at neutral grade I-II, 20s/bout x2bouts; L shoulder GH inferior mobilizations at 90 abduction grade I-II, 20s/bout x 2 bouts; L shoulder distraction mobilizations gradually progressing through partial abduction PROM; L shoulder posterior and inferior mobilizations at available end range flexion, grade I-II, 20s/bout x2bouts; L shoulder GH A/P mobilizations at90 abduction and available end range ERgrade I-II, 20s/bout x 2bouts;   Electrical Stimulation, unbilled  Utilized Continuum default TENS back setting in 2 channels to lower lumbar spine during session (modulated, 100Hz  pulse rate, 12s cycling time, 40% span, 300 micros pulse width) at pt tolerated intensity of 25 bilaterally for approximately 30 minutes. Pt reports decrease in pain from 8/10 initially to 5/10 afterward. No signs of skin irritation after removal of electrodes;    Pt educated throughout session about proper posture and technique with exercises. Improved exercise technique, movement at target joints, use of target muscles after min to mod verbal, visual, tactile cues.   Pt arrived today with  excellent motivation to participate in PT. Repeated estim today to help with low back pain and improve compliance with additional exercises. Continued with gaze stabilization and habituation exercises today and pt reports she has noticed improvement performing spontaneous head turns during walking and driving. Continued with L shoulder manual techniques to decrease L shoulder pain. Pt will benefit from PT services to address deficits in strength, balance, mobility, and painin order to return to full function at home and work.

## 2020-12-22 ENCOUNTER — Other Ambulatory Visit: Payer: Self-pay

## 2020-12-22 ENCOUNTER — Ambulatory Visit: Payer: Medicare Other

## 2020-12-22 DIAGNOSIS — M6281 Muscle weakness (generalized): Secondary | ICD-10-CM

## 2020-12-22 DIAGNOSIS — R2681 Unsteadiness on feet: Secondary | ICD-10-CM
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
## 2020-12-29 ENCOUNTER — Other Ambulatory Visit: Payer: Self-pay

## 2020-12-29 ENCOUNTER — Ambulatory Visit: Payer: Medicare Other

## 2020-12-29 DIAGNOSIS — R2681 Unsteadiness on feet: Secondary | ICD-10-CM

## 2020-12-29 DIAGNOSIS — M6281 Muscle weakness (generalized): Secondary | ICD-10-CM | POA: Diagnosis not present
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
## 2021-01-12 ENCOUNTER — Ambulatory Visit: Payer: Medicare Other | Attending: Neurology

## 2021-01-12 ENCOUNTER — Other Ambulatory Visit: Payer: Self-pay

## 2021-01-12 DIAGNOSIS — R2681 Unsteadiness on feet: Secondary | ICD-10-CM | POA: Diagnosis present

## 2021-01-12 DIAGNOSIS — M6281 Muscle weakness (generalized): Secondary | ICD-10-CM | POA: Diagnosis present

## 2021-01-12 DIAGNOSIS — M25612 Stiffness of left shoulder, not elsewhere classified: Secondary | ICD-10-CM
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
## 2021-01-18 ENCOUNTER — Ambulatory Visit: Payer: Medicare Other

## 2021-01-18 ENCOUNTER — Other Ambulatory Visit: Payer: Self-pay

## 2021-01-18 DIAGNOSIS — M6281 Muscle weakness (generalized): Secondary | ICD-10-CM

## 2021-01-18 DIAGNOSIS — R2681 Unsteadiness on feet: Secondary | ICD-10-CM

## 2021-01-18 DIAGNOSIS — M25612 Stiffness of left shoulder, not elsewhere classified: Secondary | ICD-10-CM
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
## 2021-01-25 ENCOUNTER — Other Ambulatory Visit: Payer: Self-pay

## 2021-01-25 ENCOUNTER — Encounter: Payer: Self-pay | Admitting: Physical Therapy

## 2021-01-25 ENCOUNTER — Ambulatory Visit: Payer: Medicare Other | Admitting: Physical Therapy

## 2021-01-25 DIAGNOSIS — M6281 Muscle weakness (generalized): Secondary | ICD-10-CM | POA: Diagnosis not present

## 2021-01-25 DIAGNOSIS — M25612 Stiffness of left shoulder, not elsewhere classified: Secondary | ICD-10-CM

## 2021-01-25 DIAGNOSIS — R2681 Unsteadiness on feet: Secondary | ICD-10-CM

## 2021-01-25 IMAGING — MR MR HEAD W/O CM
10 series · 48 of 48 positions shown · non-contrast
Comparison: 05/06/2020 head CT.

CLINICAL DATA: History of stroke.

EXAM:
MRI HEAD WITHOUT CONTRAST
TECHNIQUE: Multiplanar, multiecho pulse sequences of the brain and surrounding
structures were obtained without intravenous contrast.

[Series 3: DWI · axial · 3.0mm · 1.20mm/px · z∈[-61,+101]mm · 6 of 55 slices shown (1 of 4)]
[im 1/55]
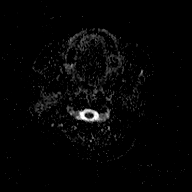
[im 11/55]
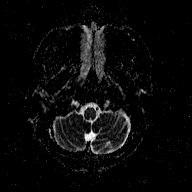
[im 22/55]
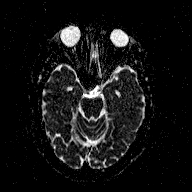
[im 33/55]
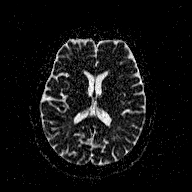
[im 44/55]
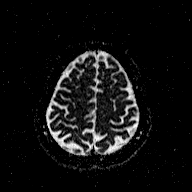
[im 55/55]
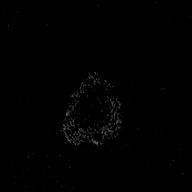

[Series 5: DWI · coronal · 3.0mm · 1.15mm/px · 4 of 43 slices shown (2 of 4)]
[im 1/43]
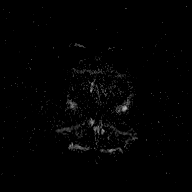
[im 15/43]
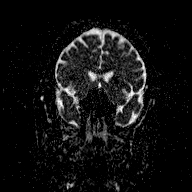
[im 29/43]
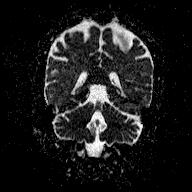
[im 43/43]
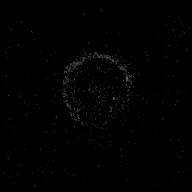

[Series 6: T1 · sagittal · 5.0mm · 0.45mm/px · 2 of 23 slices shown (1 of 2)]
[im 1/23]
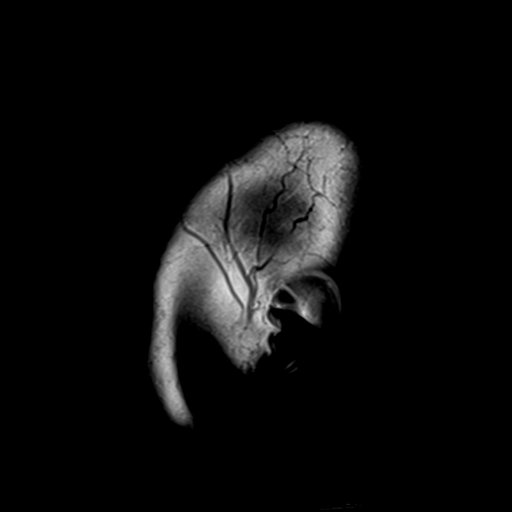
[im 23/23]
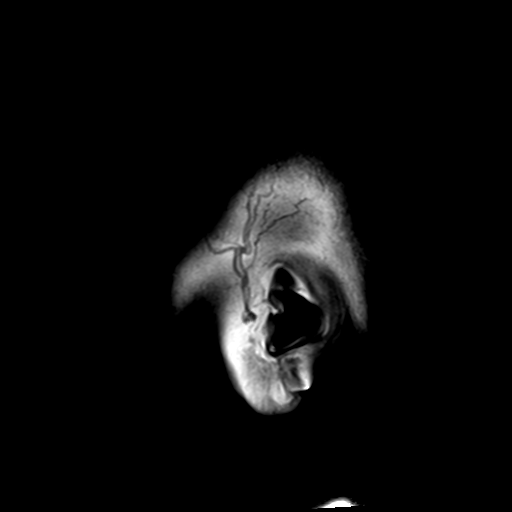

[Series 7: T2 · axial · 5.0mm · 0.72mm/px · z∈[-60,+108]mm · 2 of 25 slices shown (1 of 3)]
[im 1/25]
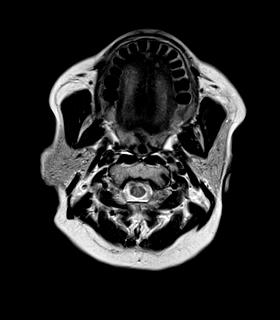
[im 25/25]
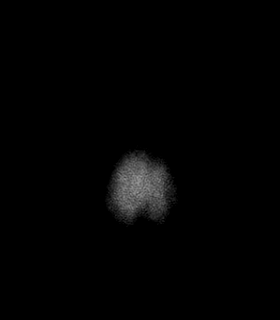

[Series 8: FLAIR · axial · 3.0mm · 0.45mm/px · z∈[-57,+105]mm · 5 of 55 slices shown]
[im 1/55]
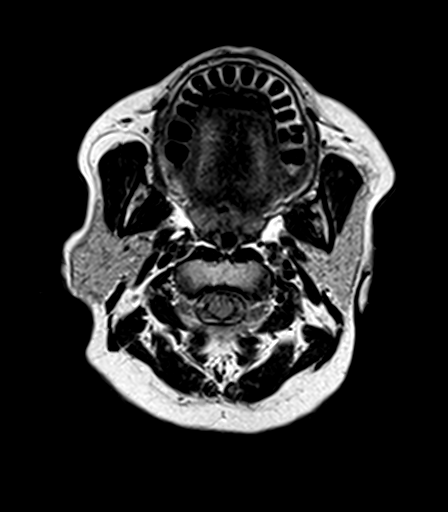
[im 14/55]
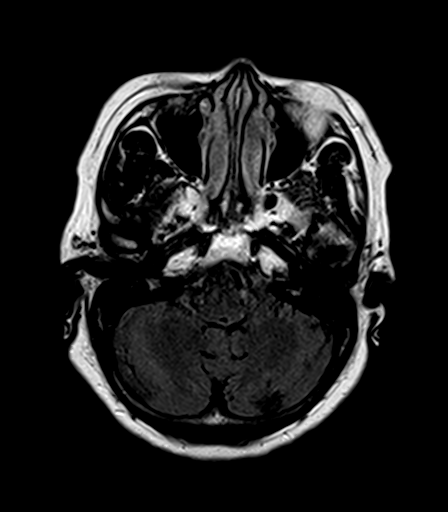
[im 28/55]
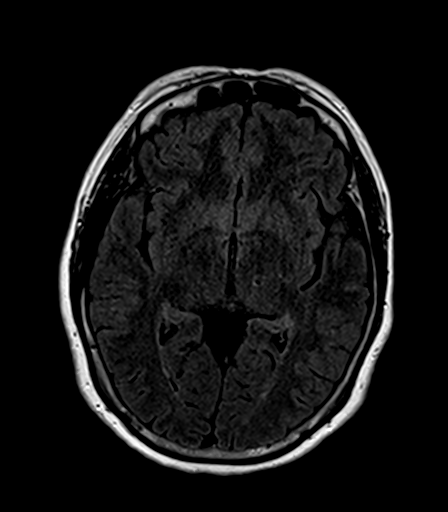
[im 41/55]
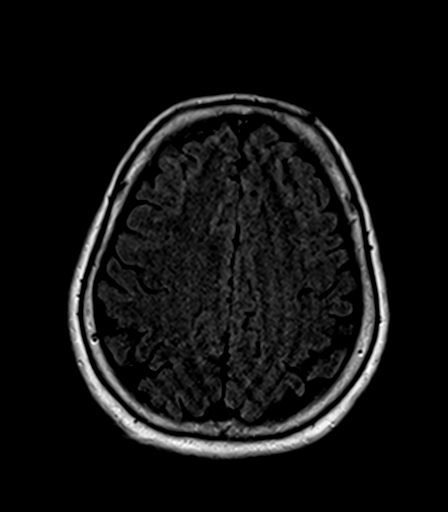
[im 55/55]
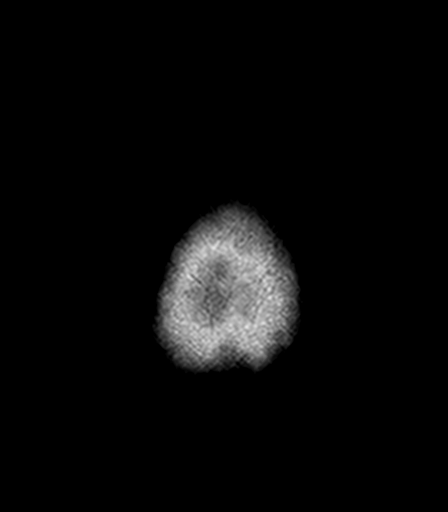

[Series 9: T2 · axial · 5.0mm · 0.72mm/px · z∈[-60,+108]mm · 2 of 25 slices shown (2 of 3)]
[im 1/25]
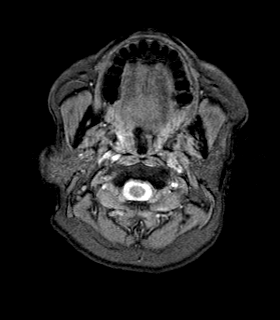
[im 25/25]
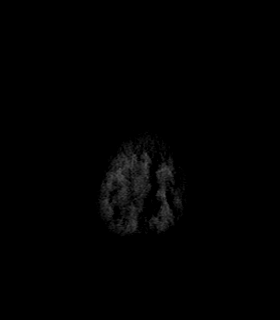

[Series 10: T1 · axial · 1.0mm · 1.00mm/px · z∈[-56,+103]mm · 15 of 160 slices shown (2 of 2)]
[im 1/160]
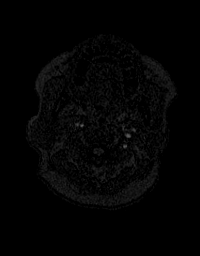
[im 12/160]
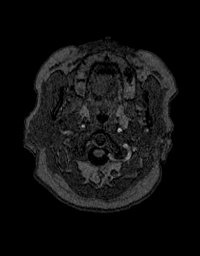
[im 23/160]
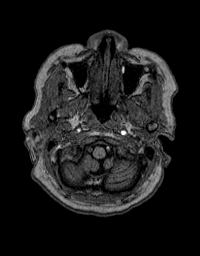
[im 35/160]
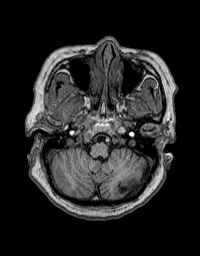
[im 46/160]
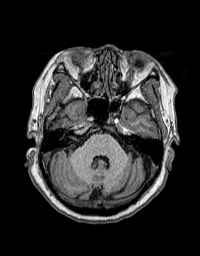
[im 57/160]
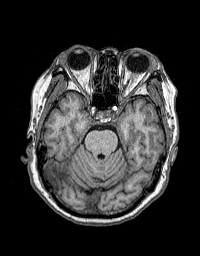
[im 69/160]
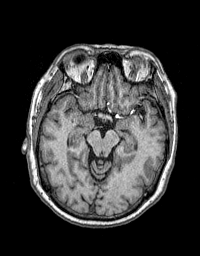
[im 80/160]
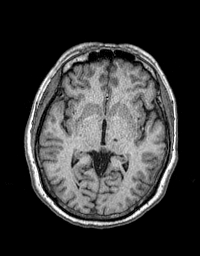
[im 91/160]
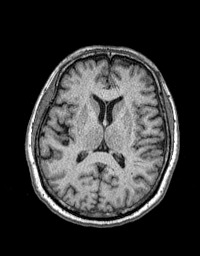
[im 103/160]
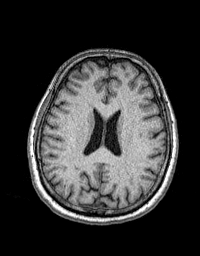
[im 114/160]
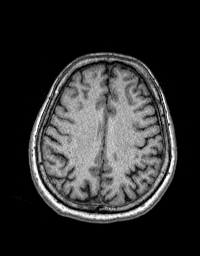
[im 125/160]
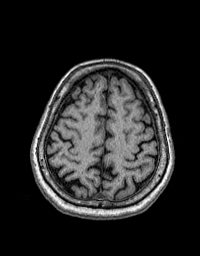
[im 137/160]
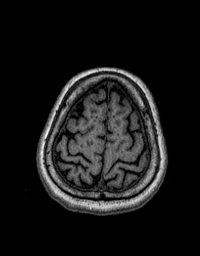
[im 148/160]
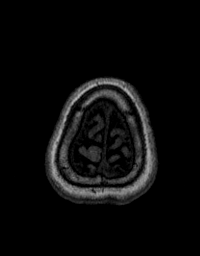
[im 160/160]
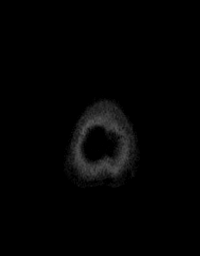

[Series 11: T2 · coronal · 5.0mm · 0.43mm/px · 3 of 29 slices shown (3 of 3)]
[im 1/29]
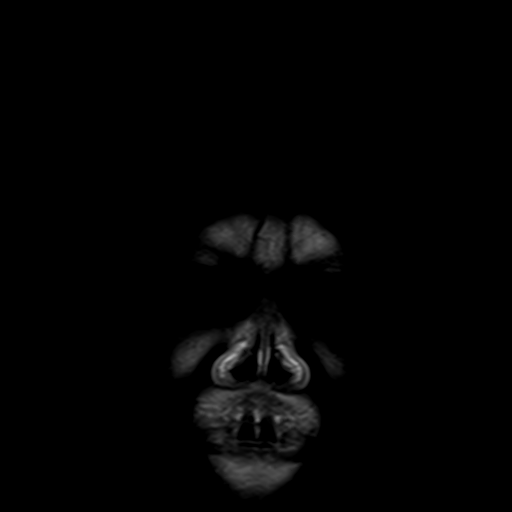
[im 15/29]
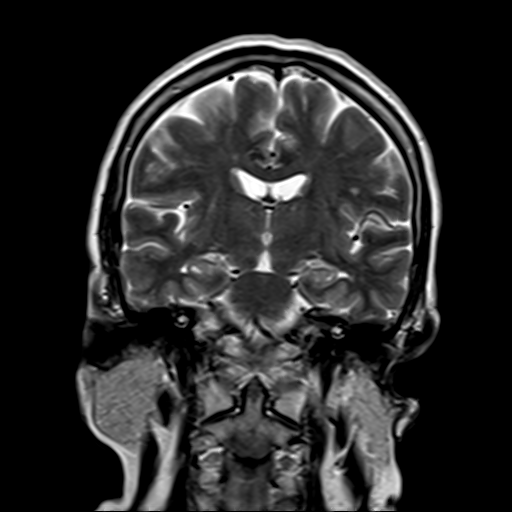
[im 29/29]
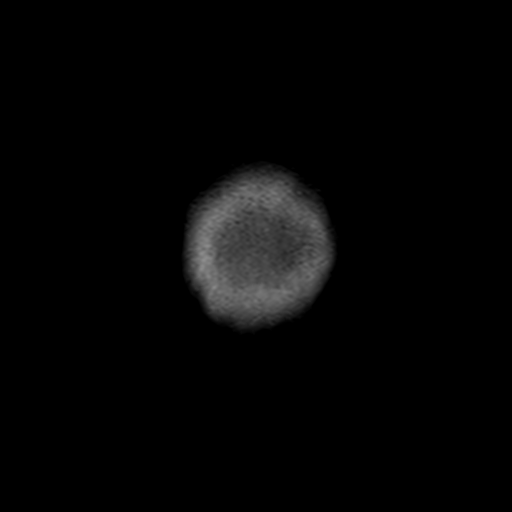

[Series 100: DWI · axial · 3.0mm · 1.20mm/px · z∈[-61,+101]mm · 5 of 55 slices shown (3 of 4)]
[im 1/55]
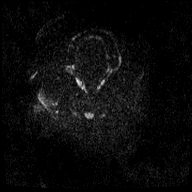
[im 14/55]
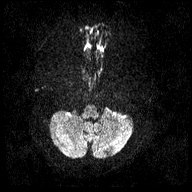
[im 28/55]
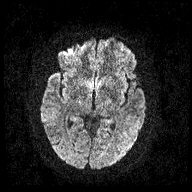
[im 41/55]
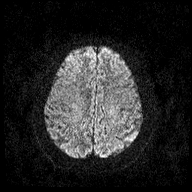
[im 55/55]
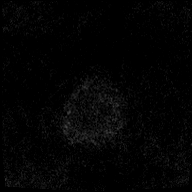

[Series 101: DWI · coronal · 3.0mm · 1.15mm/px · 4 of 46 slices shown (4 of 4)]
[im 1/46]
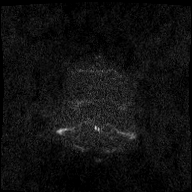
[im 16/46]
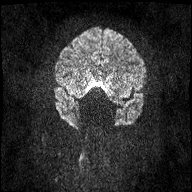
[im 31/46]
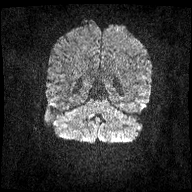
[im 46/46]
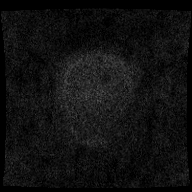

[48 of 48 positions shown; findings below may reference images not displayed]

FINDINGS: Brain: No diffusion-weighted signal abnormality. No intracranial
hemorrhage. No midline shift, ventriculomegaly or extra-axial fluid
collection. No mass lesion. Remote left cerebellar insult. Chronic
bilateral thalamic lacunar insults. Cerebral volume is within normal
limits. Minimal chronic microvascular ischemic changes.

Vascular: Normal flow voids.

Skull and upper cervical spine: Normal marrow signal.

Sinuses/Orbits: Normal orbits. Clear paranasal sinuses. No mastoid
effusion.

Other: None.
IMPRESSION: No acute intracranial process. Minimal chronic microvascular
ischemic changes.

Sequela of remote left cerebellar and bilateral thalamic insults.

## 2021-01-25 NOTE — Therapy (Signed)
[facility] [address] [place], [place], [address] Phone: [phone]   Fax:  [phone]  Physical Therapy Treatment  Patient Details  Name: Anna Nichols MRN: [id number] Date of Birth: 1961/01/27 Referring Provider (PT): Dr. Melrose Nakayama   Encounter Date: 01/25/2021   PT End of Session - 01/25/21 1709     Visit Number 39    Number of Visits 53    Date for PT Re-Evaluation 03/02/21    Authorization Type eval: 07/17/20    PT Start Time 1615    PT Stop Time 1659    PT Time Calculation (min) 44 min    Activity Tolerance Patient tolerated treatment well    Behavior During Therapy [facility] for tasks assessed/performed             Past Medical History:  Diagnosis Date   Diabetes mellitus without complication ([place])    High cholesterol    Hypertension     History reviewed. No pertinent surgical history.  There were no vitals filed for this visit.    TREATMENT      Manual Therapy  Gentle left shoulder passive range of motion into flexion, abduction, and external rotation; L shoulder GH A/P mobilizations at neutral grade I-II, 20s/bout x 2 bouts; L shoulder GH inferior mobilizations at 90 abduction grade I-II, 20s/bout x 2 bouts; L shoulder distraction mobilizations gradually progressing through partial abduction PROM;  L shoulder posterior and inferior mobilizations at available end range flexion, grade I-II, 20s/bout x 2 bouts; L shoulder GH A/P mobilizations at 90 abduction and available end range ER grade I-II, 20s/bout x 2 bouts; STM to L trap, bicep and brachialis;     Ther-ex  NuStep L2 x 6 minutes for warm-up during history; Standing hip flexion marches with 4# ankle weights (AW) x 1 minute; Standing hip abduction with 3# AW x 1 minute; Standing HS curls with 3# AW x 1 minute; Standing hip extension  with 3# AW 1 minute; Seated alternating LAQ with 3# AW 1 minute; STS from standard height chair x15;     Pt  educated throughout session about proper posture and technique with exercises. Improved exercise technique, movement at target joints, use of target muscles after min to mod verbal, visual, tactile cues.      Pt arrived today with excellent motivation to participate in PT. Continued with L shoulder manual techniques to decrease L shoulder pain and increase pain-free ROM. STM was added to manual treatment due to multiple trigger points and patient report of increased pain/sensitivity of left shoulder and upper arm. She was able to increase active and AAROM of L GH joint in frontal and sagittal planes after STM. Performed standing and seated exercises for LLE conditioning. Less LE exercises performed due to increased time spent on L shoulder. Plan to reassess/progress gaze stabilization and habituation exercises during future visits. Pt will benefit from PT services to address deficits in strength, balance, mobility, and pain in order to return to full function at home and work.          PT Short Term Goals - 12/09/20 0569       PT SHORT TERM GOAL #1   Title Pt will be independent with HEP in order to improve strength and balance in order to decrease fall risk and improve function at home.    Time 6    Period Weeks    Status Achieved  PT Long Term Goals - 12/09/20 0824       PT LONG TERM GOAL #1   Title Pt will improve ABC by at least 13% in order to demonstrate clinically significant improvement in balance confidence.    Baseline 07/17/20: 63.75%; 08/28/20: 60.625%; 10/01/20: 71.25%; 11/19/20: 75%    Time 8    Period Weeks    Status Partially Met    Target Date 03/02/21      PT LONG TERM GOAL #2   Title Pt will improve FOTO to at least 71 in order to demonstrate improvement in function    Baseline 07/17/20: 70; 08/28/20: 60; 10/01/20: 64; 11/19/20: 60    Time 12    Period Weeks    Status On-going    Target Date 03/02/21      PT LONG TERM GOAL #3   Title Pt will  improve 5TSTS to below 12s to demonstrate improvement in BLE strength;    Baseline 07/17/20: to perform at next visit; 07/21/20: 14.1s; 08/28/20: 12.5s; 10/01/20: 11.80s; 11/19/20: 10.5s;    Time 12    Period Weeks    Status Achieved      PT LONG TERM GOAL #4   Title Pt will increase 2MWT by at least 65f in order to demonstrate clinically significant improvement in cardiopulmonary endurance and community ambulation    Baseline 07/17/20: To be performed at next visit; 07/21/20: 349'; 08/28/20: 386'; 10/01/20: 371'; 11/19/20: 430'    Time 8    Period Weeks    Status Achieved      PT LONG TERM GOAL #5   Title Pt will improve L shoulder AROM flexion and abduction to within 10 degrees of R side in order to demonstrate improved functional use of shoulder at home and at work    Baseline 08/28/20: AROM: R/L Flexion: 170/140, Abduction: 160/140; 10/01/20: AROM: R/L Flexion 180/150, Abduction: 180/140    Time 8    Period Weeks    Status Deferred    Target Date 03/02/21      PT LONG TERM GOAL #6   Title Pt will decrease quick DASH score by at least 8% in order to demonstrate clinically significant reduction in disability.    Baseline 08/28/20: 70.45%; 10/01/20: 34.09%; 11/19/20: 50%    Time 12    Period Weeks    Status Achieved                   Plan - 01/25/21 1708     Clinical Impression Statement Pt arrived today with excellent motivation to participate in PT. Continued with L shoulder manual techniques to decrease L shoulder pain and increase pain-free ROM. STM was added to manual treatment due to multiple trigger points and patient report of increased pain/sensitivity of left shoulder and upper arm. She was able to increase active and AAROM of L GH joint in frontal and sagittal planes after STM. Performed standing and seated exercises for LLE conditioning. Less LE exercises performed due to increased time spent on L shoulder. Plan to reassess/progress gaze stabilization and habituation exercises  during future visits. Pt will benefit from PT services to address deficits in strength, balance, mobility, and pain in order to return to full function at home and work.    Personal Factors and Comorbidities Age;Comorbidity 3+    Comorbidities DM, HTN, hyperlipidemia, L RTC tear, multiple CVA    Examination-Activity Limitations Caring for Others;Lift;Reach Overhead;Stairs;Transfers    Examination-Participation Restrictions Church;Community Activity;Shop;Laundry;Volunteer    Stability/Clinical Decision Making Stable/Uncomplicated  Rehab Potential Fair    PT Frequency 1x / week    PT Duration 12 weeks    PT Treatment/Interventions ADLs/Self Care Home Management;Aquatic Therapy;Biofeedback;Canalith Repostioning;Cryotherapy;Electrical Stimulation;Iontophoresis 93m/ml Dexamethasone;Moist Heat;Traction;Ultrasound;DME Instruction;Gait training;Stair training;Functional mobility training;Therapeutic activities;Therapeutic exercise;Balance training;Neuromuscular re-education;Patient/family education;Manual techniques;Passive range of motion;Dry needling;Vestibular;Joint Manipulations    PT Next Visit Plan Repeat VOR x 1 horizontal and progress adaptation, introduce additional habiutation exercises, Continue to increase L shoulder ROM and L LE endurance to improve tolerance to dressing, functional reaching, and prolonged WB ADLs,    PT Home Exercise Plan Access Code: [email or web address]   Consulted and Agree with Plan of Care Patient             Patient will benefit from skilled therapeutic intervention in order to improve the following deficits and impairments:  Abnormal gait, Difficulty walking, Decreased balance, Decreased strength  Visit Diagnosis: Muscle weakness (generalized)  Stiffness of left shoulder, not elsewhere classified  Unsteadiness on feet     Problem List There are no problems to display for this patient.  KPatrina LeveringPT, DPT  [facility]6/27/2022, 5:14 PM  [facility] [address]Phone: [phone]  Fax:  [phone] Name: MClytee HeinrichMRN: [id number] of Birth: [birth year]

## 2021-02-02 ENCOUNTER — Ambulatory Visit: Payer: Medicare Other | Attending: Neurology

## 2021-02-02 ENCOUNTER — Other Ambulatory Visit: Payer: Self-pay

## 2021-02-02 DIAGNOSIS — M6281 Muscle weakness (generalized): Secondary | ICD-10-CM | POA: Diagnosis present

## 2021-02-02 DIAGNOSIS — R2681 Unsteadiness on feet: Secondary | ICD-10-CM | POA: Insufficient documentation

## 2021-02-02 DIAGNOSIS — M25612 Stiffness of left shoulder, not elsewhere classified: Secondary | ICD-10-CM | POA: Insufficient documentation
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
## 2021-02-17 ENCOUNTER — Other Ambulatory Visit: Payer: Self-pay

## 2021-02-17 ENCOUNTER — Ambulatory Visit: Payer: Medicare Other

## 2021-02-17 DIAGNOSIS — R2681 Unsteadiness on feet: Secondary | ICD-10-CM

## 2021-02-17 DIAGNOSIS — M25612 Stiffness of left shoulder, not elsewhere classified: Secondary | ICD-10-CM

## 2021-02-17 DIAGNOSIS — M6281 Muscle weakness (generalized): Secondary | ICD-10-CM
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
## 2021-02-23 ENCOUNTER — Other Ambulatory Visit: Payer: Self-pay

## 2021-02-23 ENCOUNTER — Ambulatory Visit: Payer: Medicare Other

## 2021-02-23 DIAGNOSIS — M6281 Muscle weakness (generalized): Secondary | ICD-10-CM

## 2021-02-23 DIAGNOSIS — R2681 Unsteadiness on feet: Secondary | ICD-10-CM

## 2021-02-23 DIAGNOSIS — M25612 Stiffness of left shoulder, not elsewhere classified: Secondary | ICD-10-CM
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
## 2021-03-02 ENCOUNTER — Other Ambulatory Visit: Payer: Self-pay

## 2021-03-02 ENCOUNTER — Ambulatory Visit: Payer: Medicare Other | Attending: Neurology

## 2021-03-02 DIAGNOSIS — M25612 Stiffness of left shoulder, not elsewhere classified: Secondary | ICD-10-CM | POA: Diagnosis present

## 2021-03-02 DIAGNOSIS — R2681 Unsteadiness on feet: Secondary | ICD-10-CM | POA: Insufficient documentation

## 2021-03-02 DIAGNOSIS — M6281 Muscle weakness (generalized): Secondary | ICD-10-CM
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
## 2021-03-09 ENCOUNTER — Other Ambulatory Visit: Payer: Self-pay

## 2021-03-09 ENCOUNTER — Ambulatory Visit: Payer: Medicare Other

## 2021-03-09 DIAGNOSIS — M6281 Muscle weakness (generalized): Secondary | ICD-10-CM

## 2021-03-09 DIAGNOSIS — M25612 Stiffness of left shoulder, not elsewhere classified: Secondary | ICD-10-CM

## 2021-03-09 DIAGNOSIS — R2681 Unsteadiness on feet: Secondary | ICD-10-CM
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
## 2021-03-16 ENCOUNTER — Ambulatory Visit: Payer: Medicare Other

## 2021-03-16 ENCOUNTER — Other Ambulatory Visit: Payer: Self-pay

## 2021-03-16 DIAGNOSIS — R2681 Unsteadiness on feet: Secondary | ICD-10-CM

## 2021-03-16 DIAGNOSIS — M6281 Muscle weakness (generalized): Secondary | ICD-10-CM
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
## 2021-03-23 ENCOUNTER — Other Ambulatory Visit: Payer: Self-pay

## 2021-03-23 ENCOUNTER — Ambulatory Visit: Payer: Medicare Other

## 2021-03-23 DIAGNOSIS — M6281 Muscle weakness (generalized): Secondary | ICD-10-CM

## 2021-03-23 DIAGNOSIS — R2681 Unsteadiness on feet: Secondary | ICD-10-CM
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
## 2021-03-30 ENCOUNTER — Other Ambulatory Visit: Payer: Self-pay

## 2021-03-30 ENCOUNTER — Ambulatory Visit: Payer: Medicare Other

## 2021-03-30 DIAGNOSIS — M6281 Muscle weakness (generalized): Secondary | ICD-10-CM

## 2021-03-30 DIAGNOSIS — R2681 Unsteadiness on feet: Secondary | ICD-10-CM
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
## 2021-04-06 ENCOUNTER — Other Ambulatory Visit: Payer: Self-pay

## 2021-04-06 ENCOUNTER — Ambulatory Visit: Payer: Medicare Other | Attending: Neurology

## 2021-04-06 DIAGNOSIS — R2681 Unsteadiness on feet: Secondary | ICD-10-CM | POA: Insufficient documentation

## 2021-04-06 DIAGNOSIS — M6281 Muscle weakness (generalized): Secondary | ICD-10-CM

## 2021-04-06 DIAGNOSIS — M25612 Stiffness of left shoulder, not elsewhere classified: Secondary | ICD-10-CM | POA: Diagnosis present
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
## 2021-04-28 ENCOUNTER — Ambulatory Visit: Payer: Medicare Other

## 2021-04-28 ENCOUNTER — Other Ambulatory Visit: Payer: Self-pay

## 2021-04-28 DIAGNOSIS — M6281 Muscle weakness (generalized): Secondary | ICD-10-CM

## 2021-04-28 DIAGNOSIS — R2681 Unsteadiness on feet: Secondary | ICD-10-CM
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
## 2021-05-04 ENCOUNTER — Other Ambulatory Visit: Payer: Self-pay

## 2021-05-04 ENCOUNTER — Ambulatory Visit: Payer: Medicare Other | Attending: Neurology

## 2021-05-04 DIAGNOSIS — M6281 Muscle weakness (generalized): Secondary | ICD-10-CM | POA: Insufficient documentation

## 2021-05-04 DIAGNOSIS — R2681 Unsteadiness on feet: Secondary | ICD-10-CM | POA: Diagnosis present

## 2021-05-04 DIAGNOSIS — R262 Difficulty in walking, not elsewhere classified: Secondary | ICD-10-CM | POA: Diagnosis present

## 2021-05-04 DIAGNOSIS — M25612 Stiffness of left shoulder, not elsewhere classified: Secondary | ICD-10-CM | POA: Insufficient documentation
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
## 2021-05-19 ENCOUNTER — Ambulatory Visit: Payer: Medicare Other

## 2021-05-19 ENCOUNTER — Other Ambulatory Visit: Payer: Self-pay

## 2021-05-19 DIAGNOSIS — R2681 Unsteadiness on feet: Secondary | ICD-10-CM

## 2021-05-19 DIAGNOSIS — M6281 Muscle weakness (generalized): Secondary | ICD-10-CM | POA: Diagnosis not present

## 2021-05-19 DIAGNOSIS — M25612 Stiffness of left shoulder, not elsewhere classified: Secondary | ICD-10-CM
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
## 2021-05-25 ENCOUNTER — Ambulatory Visit: Payer: Medicare Other

## 2021-05-25 ENCOUNTER — Other Ambulatory Visit: Payer: Self-pay

## 2021-05-25 DIAGNOSIS — M25612 Stiffness of left shoulder, not elsewhere classified: Secondary | ICD-10-CM

## 2021-05-25 DIAGNOSIS — M6281 Muscle weakness (generalized): Secondary | ICD-10-CM | POA: Diagnosis not present

## 2021-05-25 DIAGNOSIS — R262 Difficulty in walking, not elsewhere classified: Secondary | ICD-10-CM

## 2021-05-25 DIAGNOSIS — R2681 Unsteadiness on feet: Secondary | ICD-10-CM
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
## 2021-06-01 ENCOUNTER — Other Ambulatory Visit: Payer: Self-pay

## 2021-06-01 ENCOUNTER — Ambulatory Visit: Payer: Medicare Other | Attending: Neurology

## 2021-06-01 DIAGNOSIS — M25612 Stiffness of left shoulder, not elsewhere classified: Secondary | ICD-10-CM | POA: Diagnosis present

## 2021-06-01 DIAGNOSIS — M6281 Muscle weakness (generalized): Secondary | ICD-10-CM | POA: Insufficient documentation

## 2021-06-01 DIAGNOSIS — R2681 Unsteadiness on feet: Secondary | ICD-10-CM | POA: Insufficient documentation

## 2021-06-01 DIAGNOSIS — R262 Difficulty in walking, not elsewhere classified: Secondary | ICD-10-CM | POA: Diagnosis present
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
## 2021-06-08 ENCOUNTER — Ambulatory Visit: Payer: Medicare Other

## 2021-06-08 ENCOUNTER — Other Ambulatory Visit: Payer: Self-pay

## 2021-06-08 DIAGNOSIS — R2681 Unsteadiness on feet: Secondary | ICD-10-CM

## 2021-06-08 DIAGNOSIS — M6281 Muscle weakness (generalized): Secondary | ICD-10-CM | POA: Diagnosis not present
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
## 2021-06-29 ENCOUNTER — Other Ambulatory Visit: Payer: Self-pay

## 2021-06-29 ENCOUNTER — Ambulatory Visit: Payer: Medicare Other

## 2021-06-29 DIAGNOSIS — M6281 Muscle weakness (generalized): Secondary | ICD-10-CM | POA: Diagnosis not present

## 2021-06-29 DIAGNOSIS — R2681 Unsteadiness on feet: Secondary | ICD-10-CM
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
## 2021-07-21 ENCOUNTER — Ambulatory Visit: Payer: Medicare Other | Attending: Neurology

## 2021-07-21 ENCOUNTER — Other Ambulatory Visit: Payer: Self-pay

## 2021-07-21 DIAGNOSIS — M25511 Pain in right shoulder: Secondary | ICD-10-CM | POA: Diagnosis present

## 2021-07-21 DIAGNOSIS — G8929 Other chronic pain: Secondary | ICD-10-CM | POA: Insufficient documentation

## 2021-07-21 DIAGNOSIS — M6281 Muscle weakness (generalized): Secondary | ICD-10-CM | POA: Diagnosis not present

## 2021-07-21 DIAGNOSIS — M25611 Stiffness of right shoulder, not elsewhere classified: Secondary | ICD-10-CM | POA: Diagnosis present

## 2021-07-21 NOTE — Therapy (Signed)
[facility] [address] [place], [place], [address] Phone: [phone]   Fax:  [phone]  Physical Therapy Evaluation  Patient Details  Name: Anna Nichols MRN: [id number] Date of Birth: 1961-02-26 No data recorded  Encounter Date: 07/21/2021   PT End of Session - 07/23/21 0908     Visit Number 1    Number of Visits 17    Date for PT Re-Evaluation 09/15/21    Authorization Type eval: 07/21/21    Authorization Time Period Medicare primary; medicaid    PT Start Time 0845    PT Stop Time 0930    PT Time Calculation (min) 45 min    Activity Tolerance Patient tolerated treatment well    Behavior During Therapy [facility] for tasks assessed/performed             Past Medical History:  Diagnosis Date   Diabetes mellitus without complication (HCC)    High cholesterol    Hypertension     History reviewed. No pertinent surgical history.  There were no vitals filed for this visit.    Subjective Assessment - 07/23/21 0901     Subjective R shoulder pain    Pertinent History Pt was in [place] in July 2022, she fell to the right and struck the wall with her RUE. She had immediate pain in her R shoulder. The pain slowly improved on anti-inflammatories however persisted until she eventually went to [facility]. 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 She was previously seeing physical therapy for balance, dizziness, and L sided weakness s/p CVA x 3 however would like to address her R shoulder pain and weakness at this time. She has a return visit with orthopedics on 08/04/21.    Limitations Lifting    Diagnostic tests See history    Patient Stated Goals Improved R shoulder strength, range of motion, and pain    Currently in Pain? Yes    Pain Score 7    Worst: 7/10, Best: 0/10   Pain Location Shoulder    Pain  Orientation Right    Pain Descriptors / Indicators --   "It feels like drilling"   Pain Type Chronic pain    Pain Radiating Towards Down the lateral aspect of the RUE    Pain Onset More than a month ago    Pain Frequency Intermittent    Aggravating Factors  see initial evaluation    Pain Relieving Factors see initial evaluation                [facility] PT Assessment - 07/23/21 0903       Assessment   Medical Diagnosis R shoulder    Onset Date/Surgical Date --   Three strokes 2011, 2016, and 2019   Hand Dominance Right    Next MD Visit Not reported    Prior Therapy None for R shoulder      Precautions   Precautions None      Restrictions   Weight Bearing Restrictions No      Home Environment   Living Environment Private residence    Living Arrangements Alone    Available Help at Discharge Friend(s)    Type of Home House    Home Access Stairs to enter    Entrance Stairs-Number of Steps 3    Entrance Stairs-Rails Right;Left;Cannot reach both    Home Layout One level  Home Equipment Walker - 2 wheels;Cane - single point;Grab bars - tub/shower;Shower seat      Prior Function   Level of Independence Independent with basic ADLs    Vocation On disability    Leisure Pt reports that she likes to go on mission trips.      Cognition   Overall Cognitive Status Within Functional Limits for tasks assessed              SUBJECTIVE Onset: Pt was in [place] in July 2022, she fell to the right and struck the wall with her RUE. She had immediate pain in her R shoulder. The pain slowly improved on anti-inflammatories however persisted until she eventually went to [facility]. 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 She was previously seeing physical therapy for balance, dizziness, and L sided weakness s/p CVA x 3 however would like to address her R shoulder pain  and weakness at this time. She has a return visit with orthopedics on 08/04/21. Shoulder trauma: Yes  Pain quality: "It feels like drilling" Pain: 7/10 Present, 0/10 Best, 7/10 Worst Aggravating factors: overhead reaching, snapping bra, washing Easing factors: anti-inflammatories, heat, OTC topical analgesics 24 hour pain behavior: more pain in the AM Radiating pain: Yes, down the lateral aspect of the RUE Numbness/Tingling: No Prior history of shoulder injury or pain: No Prior history of therapy for shoulder: No Follow-up appointment with MD: Yes, 08/04/21  Dominant hand: Right  OBJECTIVE  MUSCULOSKELETAL: Tremor: Normal Bulk: Normal Tone: Normal  Cervical Screen AROM: WFL and painless with overpressure in all planes Spurlings A (ipsilateral lateral flexion/axial compression): R: Negative L: Negative Spurlings B (ipsilateral lateral flexion/contralateral rotation/axial compression): R: Negative L: Negative Repeated movement: No centralization or peripheralization with protraction or retraction Hoffman Sign (cervical cord compression): R: Not examined L: Negative ULTT Median: R: Not examined L: Not examined ULTT Ulnar: R: Not examined L: Not examined ULTT Radial: R: Not examined L: Not examined  Elbow Screen Elbow AROM: Within Normal Limits  Palpation Pain with palpation to anterior and superior shoulder  Strength R/L 3+*/4 Shoulder flexion (anterior deltoid/pec major/coracobrachialis, axillary n. (C5-6) and musculocutaneous n. (C5-7)) 3+*/4 Shoulder abduction (deltoid/supraspinatus, axillary/suprascapular n, C5) 4-/4 Shoulder external rotation (infraspinatus/teres minor), sitting 5/5 Shoulder internal rotation (subcapularis/lats/pec major), sitting NT/NT Shoulder extension (posterior deltoid, lats, teres major, axillary/thoracodorsal n.) NT/NT Shoulder horizontal abduction 5/5 Elbow flexion (biceps brachii, brachialis, brachioradialis, musculoskeletal n, C5-6) 4/4 Elbow  extension (triceps, radial n, C7) 5/5 Wrist Extension 5/5 Wrist Flexion 5/5 Finger adduction (interossei, ulnar n, T1)  AROM R/L 151/154 Shoulder flexion 124/140 Shoulder abduction 80/87 Shoulder external rotation 65/68 Shoulder internal rotation *Indicates pain, overpressure performed unless otherwise indicated   Accessory Motions/Glides Deferred   Muscle Length Testing Deferred  NEUROLOGICAL:  Mental Status Patient is oriented to person, place and time.  Recent memory is intact.  Remote memory is intact.  Attention span and concentration are intact.  Expressive speech is intact.  Patient's fund of knowledge is within normal limits for educational level.  Cranial Nerves Deferred  Sensation Deferred  Reflexes Deferred   Coordination/Cerebellar Deferred   SPECIAL TESTS  Rotator Cuff  Drop Arm Test: Negative Painful Arc (Pain from 60 to 120 degrees scaption): Positive Infraspinatus Muscle Test: Positive   Subacromial Impingement Hawkins-Kennedy: Positive Neer (Block scapula, PROM flexion): Positive Painful Arc (Pain from 60 to 120 degrees scaption): Positive Empty Can: Positive  External Rotation Resistance: Positive Horizontal Adduction: Not examined Scapular Assist: Not examined   Labral Tear Biceps Load II (120 elevation, full ER, 90 elbow flexion, full supination, resisted elbow flexion): Not examined Crank (160 scaption, axial load with IR/ER): Not examined Active Compression Test: Not examined  Bicep Tendon Pathology Speed (shoulder flexion to 90, external rotation, full elbow extension, and forearm supination with resistance: Not examined Yergason's (resisted shoulder ER and supination/biceps tendon pathology): Not examined  Shoulder Instability Sulcus Sign: Not examined Anterior Apprehension: Not examined           Objective measurements completed on examination: See above findings.                PT Education -  07/23/21 0907     Education Details Plan of care    Person(s) Educated Patient    Methods Explanation    Comprehension Verbalized understanding              PT Short Term Goals - 07/23/21 0910       PT SHORT TERM GOAL #1   Title Pt will be independent with HEP to improve strength, range of motion, and pain in order to decrease pain and improve function at home.    Time 4    Period Weeks    Status New    Target Date 08/18/21               PT Long Term Goals - 07/23/21 0911       PT LONG TERM GOAL #1   Title Pt will report worst R shoulder pain as no more than 4/10 in order to demonstrate clinically significant improvement in shoulder pain.    Baseline 07/21/21: worst: 7/10    Time 8    Period Weeks    Status New    Target Date 09/15/21      PT LONG TERM GOAL #2   Title Pt will improve R shoulder FOTO to at least 60 in order to demonstrate improvement in function related to her R shoulder    Baseline 07/21/21: 43    Time 8    Period Weeks    Status New    Target Date 09/15/21      PT LONG TERM GOAL #3   Title Pt will decrease quick DASH score by at least 8% in order to demonstrate clinically significant reduction in disability.    Baseline 07/21/21: 70.5%    Time 8    Period Weeks    Status New    Target Date 09/15/21      PT LONG TERM GOAL #4   Title Pt will increase pain-free strength of R shoulder flexion and abduction by at least 1/2 MMT grade in order to demonstrate improvement in strength and function.    Baseline 07/21/21: 3+/5 for both flexion and abduction    Time 8    Period Weeks    Status New    Target Date 09/15/21                    Plan - 07/23/21 1127     Clinical Impression Statement Pt is a pleasant year-old referred for R shoulder pain. She is tender to palpation along the superior shoulder near the insertion of the supraspinatus when head of humerus exposed as well as along the anterior shoulder. Decreased R shoulder  flexion and abduction AROM. Painful arc, positive, Neer, positive empty can, positive external rotation resistance tests. Pain and weakness  noted in R shoulder flexion and abduction. Testing today confirms concerns from MRI of RTC tear and SAIS. Pt will benefit from PT services to address deficits in strength, range of motion, and pain in order to return to full function at home with less shoulder pain.    Personal Factors and Comorbidities Age;Comorbidity 3+    Comorbidities DM, HTN, hyperlipidemia, L RTC tear, CVA x 3    Examination-Activity Limitations Caring for Others;Lift;Reach Overhead;Dressing    Examination-Participation Restrictions Church;Community Activity;Shop;Laundry;Volunteer    Stability/Clinical Decision Making Evolving/Moderate complexity    Clinical Decision Making Moderate    Rehab Potential Fair    PT Frequency 2x / week    PT Duration 8 weeks    PT Treatment/Interventions ADLs/Self Care Home Management;Aquatic Therapy;Biofeedback;Canalith Repostioning;Cryotherapy;Electrical Stimulation;Iontophoresis 4mg /ml Dexamethasone;Moist Heat;Traction;Ultrasound;DME Instruction;Gait training;Stair training;Functional mobility training;Therapeutic activities;Therapeutic exercise;Balance training;Neuromuscular re-education;Patient/family education;Manual techniques;Passive range of motion;Dry needling;Vestibular;Joint Manipulations;Spinal Manipulations    PT Next Visit Plan Initiate HEP for R shoulder, initiate range of motion, manual techniques, and strengthening    PT Home Exercise Plan None currently for R shoulder, Access Code: [email or web address] (balance and weakness from prior episode);    Consulted and Agree with Plan of Care Patient             Patient will benefit from skilled therapeutic intervention in order to improve the following deficits and impairments:  Decreased strength, Decreased range of motion, Impaired UE functional use, Pain  Visit Diagnosis: Muscle weakness  (generalized)  Chronic right shoulder pain  Stiffness of right shoulder, not elsewhere classified     Problem List There are no problems to display for this patient.  PT, DPT, GCS  Verlie Liotta, PT 07/23/2021, 11:33 AM  [facility] [address] [place], [place], [place] Phone: [phone]   Fax:  [phone]  Name: Anna Nichols MRN: Berneta Levins Date of Birth: [birth year]

## 2021-07-27 ENCOUNTER — Other Ambulatory Visit: Payer: Self-pay

## 2021-07-27 ENCOUNTER — Ambulatory Visit: Payer: Medicare Other

## 2021-07-27 DIAGNOSIS — M6281 Muscle weakness (generalized): Secondary | ICD-10-CM

## 2021-07-27 DIAGNOSIS — M25511 Pain in right shoulder: Secondary | ICD-10-CM

## 2021-07-27 NOTE — Therapy (Signed)
[place] [facility] [address] [place], [place], [address] Phone: [phone]   Fax:  [phone]  Physical Therapy Treatment  Patient Details  Name: Anna Nichols MRN: [id number] Date of Birth: [birth year] No data recorded  Encounter Date: 07/27/2021   PT End of Session - 07/27/21 1538     Visit Number 2    Number of Visits 17    Date for PT Re-Evaluation 09/15/21    Authorization Type eval: 07/21/21    Authorization Time Period Medicare primary; medicaid    PT Start Time 1531    PT Stop Time 1615    PT Time Calculation (min) 44 min    Activity Tolerance Patient tolerated treatment well    Behavior During Therapy WFL for tasks assessed/performed             Past Medical History:  Diagnosis Date   Diabetes mellitus without complication (HCC)    High cholesterol    Hypertension     History reviewed. No pertinent surgical history.  There were no vitals filed for this visit.   Subjective Assessment - 07/27/21 1537     Subjective Pt reports mild shoulder pain upon arrival. She continues to have intermittent shoulder pain. No specific questions upon arrival.    Pertinent History Pt was in [place] in July [place], she fell to the right and struck the wall with her RUE. She had immediate pain in her R shoulder. The pain slowly improved on anti-inflammatories however persisted until she eventually went to [facility]. 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 She was previously seeing physical therapy for balance, dizziness, and L sided weakness s/p CVA x 3 however would like to address her R shoulder pain and weakness at this time. She has a return visit with orthopedics on 08/04/21.    Limitations Lifting    Diagnostic tests See history    Patient Stated Goals Improved R shoulder strength, range of motion, and pain     Currently in Pain? Yes   "a little"                 TREATMENT   Ther-ex  Supine R shoulder flexion with 1# dumbbell x 10, pain reported so regressed to isometric strengthening; Isometrics for R shoulder flexion, extension, abduction, adduction, IR, and ER 5s hold x 10 each direction; Supine R shoulder serratus punch x 10; Cold pack applied to R shoulder x 5 minutes during HEP update;   Manual Therapy  Gentle right shoulder passive range of motion into flexion, abduction, and external rotation; R shoulder GH A/P mobilizations at neutral grade I-II, 20s/bout x 2 bouts; R shoulder GH inferior mobilizations at 90 abduction grade I-II, 20s/bout x 2 bouts; R shoulder distraction mobilizations gradually progressing through partial abduction PROM;  L sidelying R scapula circumduction mobilizations, attempted upward rotation mobilizations however too painful so discontinued;   Pt educated throughout session about proper posture and technique with exercises. Improved exercise technique, movement at target joints, use of target muscles after min to mod verbal, visual, tactile cues.    Pt demonstrates excellent motivation during session today. Attempted supine R shoulder strengthening with dumbbell however too painful so regressed to isometric strengthening. Introduced glenohumeral joint mobilizations for pain modulation and improved range of motion. Pt will benefit from PT services to address deficits in strength, range of motion, and pain in  order to return to full function at home.                         PT Short Term Goals - 07/23/21 0910       PT SHORT TERM GOAL #1   Title Pt will be independent with HEP to improve strength, range of motion, and pain in order to decrease pain and improve function at home.    Time 4    Period Weeks    Status New    Target Date 08/18/21               PT Long Term Goals - 07/23/21 0911       PT LONG TERM GOAL #1    Title Pt will report worst R shoulder pain as no more than 4/10 in order to demonstrate clinically significant improvement in shoulder pain.    Baseline 07/21/21: worst: 7/10    Time 8    Period Weeks    Status New    Target Date 09/15/21      PT LONG TERM GOAL #2   Title Pt will improve R shoulder FOTO to at least 60 in order to demonstrate improvement in function related to her R shoulder    Baseline 07/21/21: 43    Time 8    Period Weeks    Status New    Target Date 09/15/21      PT LONG TERM GOAL #3   Title Pt will decrease quick DASH score by at least 8% in order to demonstrate clinically significant reduction in disability.    Baseline 07/21/21: 70.5%    Time 8    Period Weeks    Status New    Target Date 09/15/21      PT LONG TERM GOAL #4   Title Pt will increase pain-free strength of R shoulder flexion and abduction by at least 1/2 MMT grade in order to demonstrate improvement in strength and function.    Baseline 07/21/21: 3+/5 for both flexion and abduction    Time 8    Period Weeks    Status New    Target Date 09/15/21                   Plan - 07/27/21 1538     Clinical Impression Statement Pt demonstrates excellent motivation during session today. Attempted supine R shoulder strengthening with dumbbell however too painful so regressed to isometric strengthening. Introduced glenohumeral joint mobilizations for pain modulation and improved range of motion. Pt will benefit from PT services to address deficits in strength, range of motion, and pain in order to return to full function at home.    Personal Factors and Comorbidities Age;Comorbidity 3+    Comorbidities DM, HTN, hyperlipidemia, L RTC tear, CVA x 3    Examination-Activity Limitations Caring for Others;Lift;Reach Overhead;Dressing    Examination-Participation Restrictions Church;Community Activity;Shop;Laundry;Volunteer    Stability/Clinical Decision Making Evolving/Moderate complexity    Rehab  Potential Fair    PT Frequency 2x / week    PT Duration 8 weeks    PT Treatment/Interventions ADLs/Self Care Home Management;Aquatic Therapy;Biofeedback;Canalith Repostioning;Cryotherapy;Electrical Stimulation;Iontophoresis 4mg /ml Dexamethasone;Moist Heat;Traction;Ultrasound;DME Instruction;Gait training;Stair training;Functional mobility training;Therapeutic activities;Therapeutic exercise;Balance training;Neuromuscular re-education;Patient/family education;Manual techniques;Passive range of motion;Dry needling;Vestibular;Joint Manipulations;Spinal Manipulations    PT Next Visit Plan Review HEP for R shoulder, continue range of motion, manual techniques, and strengthening    PT Home Exercise Plan Access Code: [email or web address] (R shoulder) Access Code: [email or web address] (balance  and weakness from prior episode);    Consulted and Agree with Plan of Care Patient             Patient will benefit from skilled therapeutic intervention in order to improve the following deficits and impairments:  Decreased strength, Decreased range of motion, Impaired UE functional use, Pain  Visit Diagnosis: Muscle weakness (generalized)  Chronic right shoulder pain     Problem List There are no problems to display for this patient.  Lynnea Maizes PT, DPT, GCS  Quinlin Conant, PT 07/27/2021, 8:30 PM  [facility] [address] [place], [place], [address] Phone: [phone]   Fax:  [phone]  Name: Anna Nichols MRN: [id number] Date of Birth: 1960-12-28

## 2021-07-27 NOTE — Patient Instructions (Signed)
Access Code: [email or web address] URL: [email or web address] Date: 07/27/2021 Prepared by: Ria Comment  Exercises Isometric Shoulder Flexion at Wall - 2 x daily - 7 x weekly - 1 sets - 10 reps - 5s hold Isometric Shoulder Abduction at Wall - 2 x daily - 7 x weekly - 1 sets - 10 reps - 5s hold Isometric Shoulder External Rotation at Wall - 2 x daily - 7 x weekly - 1 sets - 10 reps - 5s hold Isometric Shoulder Extension at Wall - 2 x daily - 7 x weekly - 1 sets - 10 reps - 5s hold Standing Isometric Shoulder Internal Rotation at Doorway - 2 x daily - 7 x weekly - 1 sets - 10 reps - 5s hold

## 2021-07-29 ENCOUNTER — Ambulatory Visit: Payer: Medicare Other

## 2021-07-29 ENCOUNTER — Other Ambulatory Visit: Payer: Self-pay

## 2021-07-29 DIAGNOSIS — M25611 Stiffness of right shoulder, not elsewhere classified: Secondary | ICD-10-CM

## 2021-07-29 DIAGNOSIS — M6281 Muscle weakness (generalized): Secondary | ICD-10-CM | POA: Diagnosis not present

## 2021-07-29 DIAGNOSIS — M25511 Pain in right shoulder: Secondary | ICD-10-CM

## 2021-07-29 NOTE — Therapy (Signed)
[facility] [facility] [address] [place], [place], [address] Phone: [phone]   Fax:  [phone]  Physical Therapy Treatment  Patient Details  Name: Anna Nichols MRN: [id number] Date of Birth: [birth year] No data recorded  Encounter Date: 07/29/2021   PT End of Session - 07/29/21 1047     Visit Number 3    Number of Visits 17    Date for PT Re-Evaluation 09/15/21    Authorization Type eval: 07/21/21    Authorization Time Period Medicare primary; medicaid    PT Start Time 1015    PT Stop Time 1100    PT Time Calculation (min) 45 min    Activity Tolerance Patient tolerated treatment well    Behavior During Therapy WFL for tasks assessed/performed             Past Medical History:  Diagnosis Date   Diabetes mellitus without complication (HCC)    High cholesterol    Hypertension     History reviewed. No pertinent surgical history.  There were no vitals filed for this visit.   Subjective Assessment - 07/29/21 1045     Subjective Pt reports 5/10 R shoulder pain upon arrival. She complains of soreness following the last therapy session. She has a busy day tomorrow volunteering at her church and is nervous about being too sore. She noticed pain with isometric flexion at home so she focused on pain-free isometric abduction. No specific questions upon arrival.    Pertinent History Pt was in [place] in July 2022, she fell to the right and struck the wall with her RUE. She had immediate pain in her R shoulder. The pain slowly improved on anti-inflammatories however persisted until she eventually went to [facility]. 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 She was previously seeing physical therapy for balance, dizziness, and L sided weakness s/p CVA x 3 however would like to address her R shoulder pain and weakness at  this time. She has a return visit with orthopedics on 08/04/21.    Limitations Lifting    Diagnostic tests See history    Patient Stated Goals Improved R shoulder strength, range of motion, and pain                  TREATMENT   Ther-ex  Isometrics for R shoulder flexion, extension, abduction, adduction, IR, and ER 10s hold x 5 each direction; Supine R elbow manually resisted flexion and extension x 10 each; Cold pack applied to R shoulder x 5 minutes at end of session;    Manual Therapy  Gentle right shoulder passive range of motion into flexion, abduction, and external rotation; R shoulder GH A/P mobilizations at neutral grade I-II, 20s/bout x 2 bouts; R shoulder GH inferior mobilizations at 90 abduction grade I-II, 20s/bout x 2 bouts; R shoulder GH A/P mobilizations at 45 abduction and pain-free end range external rotation, 20s/bout x 2 bouts; R shoulder distraction mobilizations gradually progressing through partial abduction PROM;    Pt educated throughout session about proper posture and technique with exercises. Improved exercise technique, movement at target joints, use of target muscles after min to mod verbal, visual, tactile cues.    Pt demonstrates excellent motivation during session today. Continued with isometric R shoulder strengthening secondary to increase in R shoulder pain today. Also continued with glenohumeral joint mobilizations for pain modulation and to improve  range of motion. Pt will benefit from PT services to address deficits in strength, range of motion, and pain in order to return to full function at home.                         PT Short Term Goals - 07/23/21 0910       PT SHORT TERM GOAL #1   Title Pt will be independent with HEP to improve strength, range of motion, and pain in order to decrease pain and improve function at home.    Time 4    Period Weeks    Status New    Target Date 08/18/21               PT  Long Term Goals - 07/23/21 0911       PT LONG TERM GOAL #1   Title Pt will report worst R shoulder pain as no more than 4/10 in order to demonstrate clinically significant improvement in shoulder pain.    Baseline 07/21/21: worst: 7/10    Time 8    Period Weeks    Status New    Target Date 09/15/21      PT LONG TERM GOAL #2   Title Pt will improve R shoulder FOTO to at least 60 in order to demonstrate improvement in function related to her R shoulder    Baseline 07/21/21: 43    Time 8    Period Weeks    Status New    Target Date 09/15/21      PT LONG TERM GOAL #3   Title Pt will decrease quick DASH score by at least 8% in order to demonstrate clinically significant reduction in disability.    Baseline 07/21/21: 70.5%    Time 8    Period Weeks    Status New    Target Date 09/15/21      PT LONG TERM GOAL #4   Title Pt will increase pain-free strength of R shoulder flexion and abduction by at least 1/2 MMT grade in order to demonstrate improvement in strength and function.    Baseline 07/21/21: 3+/5 for both flexion and abduction    Time 8    Period Weeks    Status New    Target Date 09/15/21                   Plan - 07/29/21 1047     Clinical Impression Statement Pt demonstrates excellent motivation during session today. Continued with isometric R shoulder strengthening secondary to increase in R shoulder pain today. Also continued with glenohumeral joint mobilizations for pain modulation and to improve range of motion. Pt will benefit from PT services to address deficits in strength, range of motion, and pain in order to return to full function at home.    Personal Factors and Comorbidities Age;Comorbidity 3+    Comorbidities DM, HTN, hyperlipidemia, L RTC tear, CVA x 3    Examination-Activity Limitations Caring for Others;Lift;Reach Overhead;Dressing    Examination-Participation Restrictions Church;Community Activity;Shop;Laundry;Volunteer    Stability/Clinical  Decision Making Evolving/Moderate complexity    Rehab Potential Fair    PT Frequency 2x / week    PT Duration 8 weeks    PT Treatment/Interventions ADLs/Self Care Home Management;Aquatic Therapy;Biofeedback;Canalith Repostioning;Cryotherapy;Electrical Stimulation;Iontophoresis 4mg /ml Dexamethasone;Moist Heat;Traction;Ultrasound;DME Instruction;Gait training;Stair training;Functional mobility training;Therapeutic activities;Therapeutic exercise;Balance training;Neuromuscular re-education;Patient/family education;Manual techniques;Passive range of motion;Dry needling;Vestibular;Joint Manipulations;Spinal Manipulations    PT Next Visit Plan Review HEP for R shoulder, continue range  of motion, manual techniques, and strengthening    PT Home Exercise Plan Access Code: [email or web address] (R shoulder) Access Code: [email or web address] (balance and weakness from prior episode);    Consulted and Agree with Plan of Care Patient             Patient will benefit from skilled therapeutic intervention in order to improve the following deficits and impairments:  Decreased strength, Decreased range of motion, Impaired UE functional use, Pain  Visit Diagnosis: Muscle weakness (generalized)  Chronic right shoulder pain  Stiffness of right shoulder, not elsewhere classified     Problem List There are no problems to display for this patient.  Lynnea Maizes PT, DPT, GCS  Finnigan Warriner, PT 07/29/2021, 6:06 PM  [facility] [address] [place], [place], [address] Phone: [phone]   Fax:  [phone]  Name: Anna Nichols MRN: [id number] Date of Birth: 1960-12-24

## 2021-08-03 ENCOUNTER — Ambulatory Visit: Payer: Medicare Other | Attending: Neurology

## 2021-08-03 ENCOUNTER — Other Ambulatory Visit: Payer: Self-pay

## 2021-08-03 DIAGNOSIS — M6281 Muscle weakness (generalized): Secondary | ICD-10-CM | POA: Insufficient documentation

## 2021-08-03 DIAGNOSIS — G8929 Other chronic pain: Secondary | ICD-10-CM | POA: Insufficient documentation

## 2021-08-03 DIAGNOSIS — M25611 Stiffness of right shoulder, not elsewhere classified: Secondary | ICD-10-CM | POA: Insufficient documentation

## 2021-08-03 DIAGNOSIS — M25511 Pain in right shoulder: Secondary | ICD-10-CM | POA: Diagnosis present

## 2021-08-03 NOTE — Therapy (Signed)
[place] [facility] [address] [place], [place], [address] Phone: [phone]   Fax:  [phone]  Physical Therapy Treatment  Patient Details  Name: Anna Nichols MRN: [id number] Date of Birth: Jun 27, 1961 No data recorded  Encounter Date: 08/03/2021   PT End of Session - 08/03/21 1007     Visit Number 4    Number of Visits 17    Date for PT Re-Evaluation 09/15/21    Authorization Type eval: 07/21/21    Authorization Time Period Medicare primary; medicaid    PT Start Time 0800    PT Stop Time 0845    PT Time Calculation (min) 45 min    Activity Tolerance Patient tolerated treatment well    Behavior During Therapy WFL for tasks assessed/performed              Past Medical History:  Diagnosis Date   Diabetes mellitus without complication ([place])    High cholesterol    Hypertension     History reviewed. No pertinent surgical history.  There were no vitals filed for this visit.   Subjective Assessment - 08/03/21 1004     Subjective Pt reports 4/10 R shoulder pain upon arrival. Slight improvement in shoulder since last therapy session. No specific questions upon arrival.    Pertinent History Pt was in [place] in July 2022, she fell to the right and struck the wall with her RUE. She had immediate pain in her R shoulder. The pain slowly improved on anti-inflammatories however persisted until she eventually went to [facility]. 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 She was previously seeing physical therapy for balance, dizziness, and L sided weakness s/p CVA x 3 however would like to address her R shoulder pain and weakness at this time. She has a return visit with orthopedics on 08/04/21.    Limitations Lifting    Diagnostic tests See history    Patient Stated Goals Improved R shoulder strength, range of motion,  and pain                TREATMENT   Ther-ex  Moist heat pack applied to R shoulder in supine during interval history x 5 minutes Isometrics for R shoulder flexion, extension, abduction, adduction, IR, and ER 5s hold x 10 each direction; Supine R elbow manually resisted flexion and extension x 10 each; Supine R shoulder flexion with 1# dumbbell (DB), pain slightly increases so regressed to bent elbow flexion 2 x 10; Supine R shoulder rhythmic stabilization at 90 flexion with resistance at elbow 2 x 30s; Supine R shoulder serratus punch at 90 flexion with elbow bent (pain increases with elbow extended) 2 x 10; Cold pack applied to R shoulder x 5 minutes at end of session;    Manual Therapy  Gentle right shoulder passive range of motion into flexion, abduction, and external rotation; R shoulder GH A/P mobilizations at neutral grade I-II, 20s/bout x 2 bouts; R shoulder GH inferior mobilizations at 90 abduction grade I-II, 20s/bout x 2 bouts; R shoulder GH A/P mobilizations at 45 abduction and pain-free end range external rotation, 20s/bout x 2 bouts; R shoulder distraction mobilizations gradually progressing through partial abduction PROM;    Pt educated throughout session about proper posture and technique with exercises. Improved exercise technique, movement at target joints, use of target muscles after min to mod verbal, visual, tactile cues.  Pt demonstrates excellent motivation during session today. Continued with isometric R shoulder strengthening secondary to increase in R shoulder pain today. She was able to perform some bent elbow flexion with 1# dumbbell (straight arm is too painful). Also continued with glenohumeral joint mobilizations for pain modulation and to improve range of motion. Pt will benefit from PT services to address deficits in strength, range of motion, and pain in order to return to full function at home.                         PT  Short Term Goals - 07/23/21 0910       PT SHORT TERM GOAL #1   Title Pt will be independent with HEP to improve strength, range of motion, and pain in order to decrease pain and improve function at home.    Time 4    Period Weeks    Status New    Target Date 08/18/21               PT Long Term Goals - 07/23/21 0911       PT LONG TERM GOAL #1   Title Pt will report worst R shoulder pain as no more than 4/10 in order to demonstrate clinically significant improvement in shoulder pain.    Baseline 07/21/21: worst: 7/10    Time 8    Period Weeks    Status New    Target Date 09/15/21      PT LONG TERM GOAL #2   Title Pt will improve R shoulder FOTO to at least 60 in order to demonstrate improvement in function related to her R shoulder    Baseline 07/21/21: 43    Time 8    Period Weeks    Status New    Target Date 09/15/21      PT LONG TERM GOAL #3   Title Pt will decrease quick DASH score by at least 8% in order to demonstrate clinically significant reduction in disability.    Baseline 07/21/21: 70.5%    Time 8    Period Weeks    Status New    Target Date 09/15/21      PT LONG TERM GOAL #4   Title Pt will increase pain-free strength of R shoulder flexion and abduction by at least 1/2 MMT grade in order to demonstrate improvement in strength and function.    Baseline 07/21/21: 3+/5 for both flexion and abduction    Time 8    Period Weeks    Status New    Target Date 09/15/21                   Plan - 08/03/21 1008     Clinical Impression Statement Pt demonstrates excellent motivation during session today. Continued with isometric R shoulder strengthening secondary to increase in R shoulder pain today. She was able to perform some bent elbow flexion with 1# dumbbell (straight arm is too painful). Also continued with glenohumeral joint mobilizations for pain modulation and to improve range of motion. Pt will benefit from PT services to address deficits in  strength, range of motion, and pain in order to return to full function at home.    Personal Factors and Comorbidities Age;Comorbidity 3+    Comorbidities DM, HTN, hyperlipidemia, L RTC tear, CVA x 3    Examination-Activity Limitations Caring for Others;Lift;Reach Overhead;Dressing    Examination-Participation Restrictions Church;Community Activity;Shop;Laundry;Volunteer    Stability/Clinical Decision Making Evolving/Moderate complexity  Rehab Potential Fair    PT Frequency 2x / week    PT Duration 8 weeks    PT Treatment/Interventions ADLs/Self Care Home Management;Aquatic Therapy;Biofeedback;Canalith Repostioning;Cryotherapy;Electrical Stimulation;Iontophoresis 4mg /ml Dexamethasone;Moist Heat;Traction;Ultrasound;DME Instruction;Gait training;Stair training;Functional mobility training;Therapeutic activities;Therapeutic exercise;Balance training;Neuromuscular re-education;Patient/family education;Manual techniques;Passive range of motion;Dry needling;Vestibular;Joint Manipulations;Spinal Manipulations    PT Next Visit Plan Review HEP for R shoulder, continue range of motion, manual techniques, and strengthening    PT Home Exercise Plan Access Code: [email or web address] (R shoulder) Access Code: [email or web address] (balance and weakness from prior episode);    Consulted and Agree with Plan of Care Patient              Patient will benefit from skilled therapeutic intervention in order to improve the following deficits and impairments:  Decreased strength, Decreased range of motion, Impaired UE functional use, Pain  Visit Diagnosis: Muscle weakness (generalized)     Problem List There are no problems to display for this patient.  Phillips Grout PT, DPT, GCS  Aaliyah Gavel, PT 08/03/2021, 12:18 PM  [facility] [address] [place], [place], [address] Phone: [phone]   Fax:  [phone]  Name: Anna Nichols MRN: [id number] Date of Birth:  [birth year]

## 2021-08-05 ENCOUNTER — Other Ambulatory Visit: Payer: Self-pay

## 2021-08-05 ENCOUNTER — Ambulatory Visit: Payer: Medicare Other

## 2021-08-05 DIAGNOSIS — M6281 Muscle weakness (generalized): Secondary | ICD-10-CM | POA: Diagnosis not present

## 2021-08-05 DIAGNOSIS — M25511 Pain in right shoulder: Secondary | ICD-10-CM

## 2021-08-05 DIAGNOSIS — G8929 Other chronic pain: Secondary | ICD-10-CM

## 2021-08-05 NOTE — Therapy (Signed)
[facility] [facility] [address] [place], [place], [address] Phone: [phone]   Fax:  [phone]  Physical Therapy Treatment  Patient Details  Name: Anna Nichols MRN: [id number] Date of Birth: [birth year] No data recorded  Encounter Date: 08/05/2021   PT End of Session - 08/05/21 1341     Visit Number 5    Number of Visits 17    Date for PT Re-Evaluation 09/15/21    Authorization Type eval: 07/21/21    Authorization Time Period Medicare primary; medicaid    PT Start Time 1345    PT Stop Time 1430    PT Time Calculation (min) 45 min    Activity Tolerance Patient tolerated treatment well    Behavior During Therapy WFL for tasks assessed/performed              Past Medical History:  Diagnosis Date   Diabetes mellitus without complication (HCC)    High cholesterol    Hypertension     History reviewed. No pertinent surgical history.  There were no vitals filed for this visit.   Subjective Assessment - 08/05/21 1340     Subjective Pt reports 7/10 R shoulder pain upon arrival. She reports feeling tired today with more pain currently than normal. She saw orthopedics and they were pleased with her progress. She has a follow-up appointment in March. No specific questions upon arrival.    Pertinent History Pt was in [place] in July 2022, she fell to the right and struck the wall with her RUE. She had immediate pain in her R shoulder. The pain slowly improved on anti-inflammatories however persisted until she eventually went to [facility]. 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 She was previously seeing physical therapy for balance, dizziness, and L sided weakness s/p CVA x 3 however would like to address her R shoulder pain and weakness at this time. She has a return visit with orthopedics on 08/04/21.    Limitations  Lifting    Diagnostic tests See history    Patient Stated Goals Improved R shoulder strength, range of motion, and pain                TREATMENT   Ther-ex  Moist heat pack applied to R shoulder in supine during interval history x 5 minutes Supine R shoulder flexion with 1# dumbbell (DB) 2 x 10; Supine R shoulder rhythmic stabilization at 90 flexion with resistance at elbow 2 x 30s; Supine R shoulder serratus punch at 90 flexion with 1# DB, 2 x 10; Supine R shoulder circles with 1# DB clockwise/counterclockwise 2 x 10 each L sidelying R shoulder abduction with 1# DB 2 x 10; L sidelying R shoulder ER with 1# DB 2 x 10; L sidelying R shoulder horizontal abduction with 1# DB x 5, dc secondary to increase in pain; Nautilus tricep press down 20# x 10; Nautilus shoulder extension 20# x 10; Nautilus bicep curls 10# x 10;   Manual Therapy  Gentle right shoulder passive range of motion into flexion, abduction, and external rotation; R shoulder GH A/P mobilizations at neutral grade I-II, 20s/bout x 2 bouts; R shoulder GH inferior mobilizations at 90 abduction grade I-II, 20s/bout x 2 bouts; R shoulder GH A/P mobilizations at 45 abduction and pain-free end range external rotation, 20s/bout x 2 bouts; R shoulder distraction mobilizations gradually progressing through partial abduction PROM;  Pt educated throughout session about proper posture and technique with exercises. Improved exercise technique, movement at target joints, use of target muscles after min to mod verbal, visual, tactile cues.    Pt demonstrates excellent motivation during session today. Progressed R shoulder strengthening today and utilized Nautilus machine to progress resistance.  She denies any increase in pain but reports difficulty with exercises. Also continued with glenohumeral joint mobilizations for pain modulation and to improve range of motion. Pt will benefit from PT services to address deficits in  strength, range of motion, and pain in order to return to full function at home.                         PT Short Term Goals - 07/23/21 0910       PT SHORT TERM GOAL #1   Title Pt will be independent with HEP to improve strength, range of motion, and pain in order to decrease pain and improve function at home.    Time 4    Period Weeks    Status New    Target Date 08/18/21               PT Long Term Goals - 07/23/21 0911       PT LONG TERM GOAL #1   Title Pt will report worst R shoulder pain as no more than 4/10 in order to demonstrate clinically significant improvement in shoulder pain.    Baseline 07/21/21: worst: 7/10    Time 8    Period Weeks    Status New    Target Date 09/15/21      PT LONG TERM GOAL #2   Title Pt will improve R shoulder FOTO to at least 60 in order to demonstrate improvement in function related to her R shoulder    Baseline 07/21/21: 43    Time 8    Period Weeks    Status New    Target Date 09/15/21      PT LONG TERM GOAL #3   Title Pt will decrease quick DASH score by at least 8% in order to demonstrate clinically significant reduction in disability.    Baseline 07/21/21: 70.5%    Time 8    Period Weeks    Status New    Target Date 09/15/21      PT LONG TERM GOAL #4   Title Pt will increase pain-free strength of R shoulder flexion and abduction by at least 1/2 MMT grade in order to demonstrate improvement in strength and function.    Baseline 07/21/21: 3+/5 for both flexion and abduction    Time 8    Period Weeks    Status New    Target Date 09/15/21                   Plan - 08/05/21 1341     Clinical Impression Statement Pt demonstrates excellent motivation during session today. Progressed R shoulder strengthening today and utilized Nautilus machine to progress resistance.  She denies any increase in pain but reports difficulty with exercises. Also continued with glenohumeral joint mobilizations for pain  modulation and to improve range of motion. Pt will benefit from PT services to address deficits in strength, range of motion, and pain in order to return to full function at home.    Personal Factors and Comorbidities Age;Comorbidity 3+    Comorbidities DM, HTN, hyperlipidemia, L RTC tear, CVA x 3    Examination-Activity Limitations  Caring for Others;Lift;Reach Overhead;Dressing    Examination-Participation Restrictions Church;Community Activity;Shop;Laundry;Volunteer    Stability/Clinical Decision Making Evolving/Moderate complexity    Rehab Potential Fair    PT Frequency 2x / week    PT Duration 8 weeks    PT Treatment/Interventions ADLs/Self Care Home Management;Aquatic Therapy;Biofeedback;Canalith Repostioning;Cryotherapy;Electrical Stimulation;Iontophoresis 4mg /ml Dexamethasone;Moist Heat;Traction;Ultrasound;DME Instruction;Gait training;Stair training;Functional mobility training;Therapeutic activities;Therapeutic exercise;Balance training;Neuromuscular re-education;Patient/family education;Manual techniques;Passive range of motion;Dry needling;Vestibular;Joint Manipulations;Spinal Manipulations    PT Next Visit Plan Review HEP for R shoulder, continue range of motion, manual techniques, and strengthening    PT Home Exercise Plan Access Code: [email or web address] (R shoulder) Access Code: [email or web address] (balance and weakness from prior episode);    Consulted and Agree with Plan of Care Patient              Patient will benefit from skilled therapeutic intervention in order to improve the following deficits and impairments:  Decreased strength, Decreased range of motion, Impaired UE functional use, Pain  Visit Diagnosis: Muscle weakness (generalized)  Chronic right shoulder pain     Problem List There are no problems to display for this patient.  PT, DPT, GCS  Linsie Lupo, PT 08/05/2021, 2:35 PM  [place] [facility] [address] [place], [place], [place] Phone: [phone]   Fax:  [phone]  Name: Anna Nichols MRN: [id number] [id number] Date of Birth: 04-24-1961

## 2021-08-10 ENCOUNTER — Ambulatory Visit: Payer: Medicare Other

## 2021-08-10 ENCOUNTER — Other Ambulatory Visit: Payer: Self-pay

## 2021-08-10 DIAGNOSIS — M6281 Muscle weakness (generalized): Secondary | ICD-10-CM | POA: Diagnosis not present

## 2021-08-10 DIAGNOSIS — G8929 Other chronic pain: Secondary | ICD-10-CM

## 2021-08-10 DIAGNOSIS — M25511 Pain in right shoulder: Secondary | ICD-10-CM

## 2021-08-10 NOTE — Therapy (Signed)
[place] [facility] [address]. [place], [place], [address] Phone: [phone]   Fax:  [phone]  Physical Therapy Treatment  Patient Details  Name: Anna Nichols MRN: [id number] Date of Birth: 01-19-1961 No data recorded  Encounter Date: 08/10/2021   PT End of Session - 08/10/21 0813     Visit Number 6    Number of Visits 17    Date for PT Re-Evaluation 09/15/21    Authorization Type eval: 07/21/21    Authorization Time Period Medicare primary; medicaid    PT Start Time 0800    PT Stop Time 0845    PT Time Calculation (min) 45 min    Activity Tolerance Patient tolerated treatment well    Behavior During Therapy [facility] for tasks assessed/performed              Past Medical History:  Diagnosis Date   Diabetes mellitus without complication ([place])    High cholesterol    Hypertension     History reviewed. No pertinent surgical history.  There were no vitals filed for this visit.   Subjective Assessment - 08/10/21 0810     Subjective Pt reports 7-8/10 R shoulder pain upon arrival. No excessive soreness after the last therapy session. No specific questions upon arrival.    Pertinent History Pt was in [place] in July 2022, she fell to the right and struck the wall with her RUE. She had immediate pain in her R shoulder. The pain slowly improved on anti-inflammatories however persisted until she eventually went to [facility]. 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 She was previously seeing physical therapy for balance, dizziness, and L sided weakness s/p CVA x 3 however would like to address her R shoulder pain and weakness at this time. She has a return visit with orthopedics on 08/04/21.    Limitations Lifting    Diagnostic tests See history    Patient Stated Goals Improved R shoulder strength, range of motion, and  pain    Currently in Pain? --                TREATMENT   Ther-ex  Moist heat pack applied to R shoulder in supine during interval history x 5 minutes Attempted supine R shoulder flexion with 1# dumbbell (DB), performed 3 reps but too painful so regressed to isometrics; Isometrics for R shoulder flexion, extension, abduction, adduction, IR, and ER 10s hold x 5 each direction; Supine R shoulder flexion from neutral to shoulder height without external resistance x 10; Supine R elbow manually resisted flexion and extension x 10 each; Standing green tband rows with cues for scap retraction x 10;   Manual Therapy  Gentle right shoulder passive range of motion into flexion, abduction, and external rotation; R shoulder GH A/P mobilizations at neutral grade I-II, 30s/bout x 2 bouts; R shoulder GH inferior mobilizations at 90 abduction grade I-II, 30s/bout x 2 bouts; R shoulder GH A/P mobilizations at 45 abduction and pain-free end range external rotation, 30s/bout x 2 bouts; R shoulder distraction mobilizations gradually progressing through partial abduction PROM;    Pt educated throughout session about proper posture and technique with exercises. Improved exercise technique, movement at target joints, use of target muscles after min to mod verbal, visual, tactile cues.    Pt demonstrates excellent motivation during session today. Repeated manual techniques for R shoulder for pain  modulation and range of motion. Also repeated R shoulder strengthening with dumbbell however pain is more irritable today so regressed to isometrics. Pt will benefit from PT services to address deficits in strength, range of motion, and pain in order to return to full function at home.                         PT Short Term Goals - 07/23/21 0910       PT SHORT TERM GOAL #1   Title Pt will be independent with HEP to improve strength, range of motion, and pain in order to decrease pain and  improve function at home.    Time 4    Period Weeks    Status New    Target Date 08/18/21               PT Long Term Goals - 07/23/21 0911       PT LONG TERM GOAL #1   Title Pt will report worst R shoulder pain as no more than 4/10 in order to demonstrate clinically significant improvement in shoulder pain.    Baseline 07/21/21: worst: 7/10    Time 8    Period Weeks    Status New    Target Date 09/15/21      PT LONG TERM GOAL #2   Title Pt will improve R shoulder FOTO to at least 60 in order to demonstrate improvement in function related to her R shoulder    Baseline 07/21/21: 43    Time 8    Period Weeks    Status New    Target Date 09/15/21      PT LONG TERM GOAL #3   Title Pt will decrease quick DASH score by at least 8% in order to demonstrate clinically significant reduction in disability.    Baseline 07/21/21: 70.5%    Time 8    Period Weeks    Status New    Target Date 09/15/21      PT LONG TERM GOAL #4   Title Pt will increase pain-free strength of R shoulder flexion and abduction by at least 1/2 MMT grade in order to demonstrate improvement in strength and function.    Baseline 07/21/21: 3+/5 for both flexion and abduction    Time 8    Period Weeks    Status New    Target Date 09/15/21                   Plan - 08/10/21 0813     Clinical Impression Statement Pt demonstrates excellent motivation during session today. Repeated manual techniques for R shoulder for pain modulation and range of motion. Also repeated R shoulder strengthening with dumbbell however pain is more irritable today so regressed to isometrics. Pt will benefit from PT services to address deficits in strength, range of motion, and pain in order to return to full function at home.    Personal Factors and Comorbidities Age;Comorbidity 3+    Comorbidities DM, HTN, hyperlipidemia, L RTC tear, CVA x 3    Examination-Activity Limitations Caring for Others;Lift;Reach Overhead;Dressing     Examination-Participation Restrictions Church;Community Activity;Shop;Laundry;Volunteer    Stability/Clinical Decision Making Evolving/Moderate complexity    Rehab Potential Fair    PT Frequency 2x / week    PT Duration 8 weeks    PT Treatment/Interventions ADLs/Self Care Home Management;Aquatic Therapy;Biofeedback;Canalith Repostioning;Cryotherapy;Electrical Stimulation;Iontophoresis 4mg /ml Dexamethasone;Moist Heat;Traction;Ultrasound;DME Instruction;Gait training;Stair training;Functional mobility training;Therapeutic activities;Therapeutic exercise;Balance training;Neuromuscular re-education;Patient/family education;Manual techniques;Passive  range of motion;Dry needling;Vestibular;Joint Manipulations;Spinal Manipulations    PT Next Visit Plan Review HEP for R shoulder, continue range of motion, manual techniques, and strengthening    PT Home Exercise Plan Access Code: [email or web address] (R shoulder) Access Code: [email or web address] (balance and weakness from prior episode);    Consulted and Agree with Plan of Care Patient              Patient will benefit from skilled therapeutic intervention in order to improve the following deficits and impairments:  Decreased strength, Decreased range of motion, Impaired UE functional use, Pain  Visit Diagnosis: Muscle weakness (generalized)  Chronic right shoulder pain     Problem List There are no problems to display for this patient.  Phillips Grout PT, DPT, GCS  Menaal Russum, PT 08/10/2021, 10:03 AM  [facility] [address]. [place], [place], [address] Phone: [phone]   Fax:  [phone]  Name: Anna Nichols MRN: [id number] Date of Birth: 09/03/1960

## 2021-08-12 ENCOUNTER — Ambulatory Visit: Payer: Medicare Other

## 2021-08-12 ENCOUNTER — Other Ambulatory Visit: Payer: Self-pay

## 2021-08-12 DIAGNOSIS — M6281 Muscle weakness (generalized): Secondary | ICD-10-CM | POA: Diagnosis not present

## 2021-08-12 DIAGNOSIS — G8929 Other chronic pain: Secondary | ICD-10-CM

## 2021-08-12 DIAGNOSIS — M25611 Stiffness of right shoulder, not elsewhere classified: Secondary | ICD-10-CM

## 2021-08-12 NOTE — Therapy (Addendum)
[place] [facility] [address] [place], [place], [address] Phone: [phone]   Fax:  [phone]  Physical Therapy Treatment  Patient Details  Name: Anna Nichols MRN: [id number] Date of Birth: 1961-05-26 No data recorded  Encounter Date: 08/12/2021   PT End of Session - 08/12/21 1356     Visit Number 7    Number of Visits 17    Date for PT Re-Evaluation 09/15/21    Authorization Type eval: 07/21/21    Authorization Time Period Medicare primary; medicaid    PT Start Time 1400    PT Stop Time 1445    PT Time Calculation (min) 45 min    Activity Tolerance Patient tolerated treatment well    Behavior During Therapy W[facility] for tasks assessed/performed             Past Medical History:  Diagnosis Date   Diabetes mellitus without complication (H[facility])    High cholesterol    Hypertension     History reviewed. No pertinent surgical history.  There were no vitals filed for this visit.   Subjective Assessment - 08/12/21 1356     Subjective Pt reports pain below 5/10 upon arrival. Pt states pain has been cycling up and down today but has been staying under 5. No soreness after last session.    Pertinent History Pt was in [place] in July 2022, she fell to the right and struck the wall with her RUE. She had immediate pain in her R shoulder. The pain slowly improved on anti-inflammatories however persisted until she eventually went to [facility]. 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 She was previously seeing physical therapy for balance, dizziness, and L sided weakness s/p CVA x 3 however would like to address her R shoulder pain and weakness at this time. She has a return visit with orthopedics on 08/04/21.    Limitations Lifting    Diagnostic tests See history    Patient Stated Goals Improved R shoulder strength, range of  motion, and pain             TREATMENT   Ther-ex  Moist heat pack applied to R shoulder in supine during interval history x 5 minutes Attempted supine R shoulder flexion with bodyweight x 4 but painful so regressed to isometrics; Isometrics for R shoulder flexion, extension, abduction, adduction, IR, and ER 10s hold x 5 each direction; Supine R shoulder flexion to shoulder level hold for dynamic stabilization 10s x 4 Seated bodyweight rows x 10 cueing to focus on pinching shoulder blades together    Manual Therapy  Gentle right shoulder passive range of motion into flexion, abduction, and external rotation R shoulder GH A/P mobilizations at neutral grade I-II, 30s/bout x 2 bouts R shoulder GH inferior mobilizations at 90 abduction grade I-II, 30s/bout x 2 bouts R shoulder GH A/P mobilizations at 45 abduction and pain-free end range external rotation, 30s/bout x 2 bouts    Pt demonstrated excellent motivation during session today. PROM without pain improved from last session. Manual techniques for R shoulder done for pain and ROM. Pt unable to tolerate R shoulder GH A/P grade II mobilizations due to pinching pain in anterior shoulder. Needed frequent breaks between isometrics due to fatigue and pain. Took sidelying break between isometric exercises and dynamic stabilization due to back pain. Required minimal cueing during rows to intiate scapular muscles.  Pt will benefit from PT services to address deficits in strength, ROM and pain to return to full function at home.                             PT Short Term Goals - 07/23/21 0910       PT SHORT TERM GOAL #1   Title Pt will be independent with HEP to improve strength, range of motion, and pain in order to decrease pain and improve function at home.    Time 4    Period Weeks    Status New    Target Date 08/18/21               PT Long Term Goals - 07/23/21 0911       PT LONG TERM GOAL #1    Title Pt will report worst R shoulder pain as no more than 4/10 in order to demonstrate clinically significant improvement in shoulder pain.    Baseline 07/21/21: worst: 7/10    Time 8    Period Weeks    Status New    Target Date 09/15/21      PT LONG TERM GOAL #2   Title Pt will improve R shoulder FOTO to at least 60 in order to demonstrate improvement in function related to her R shoulder    Baseline 07/21/21: 43    Time 8    Period Weeks    Status New    Target Date 09/15/21      PT LONG TERM GOAL #3   Title Pt will decrease quick DASH score by at least 8% in order to demonstrate clinically significant reduction in disability.    Baseline 07/21/21: 70.5%    Time 8    Period Weeks    Status New    Target Date 09/15/21      PT LONG TERM GOAL #4   Title Pt will increase pain-free strength of R shoulder flexion and abduction by at least 1/2 MMT grade in order to demonstrate improvement in strength and function.    Baseline 07/21/21: 3+/5 for both flexion and abduction    Time 8    Period Weeks    Status New    Target Date 09/15/21                   Plan - 08/12/21 1357     Clinical Impression Statement Pt demonstrated excellent motivation during session today. PROM without pain improved from last session. Manual techniques for R shoulder done for pain and ROM. Pt unable to tolerate R shoulder GH A/P grade II mobilizations due to pinching pain in anterior shoulder. Needed frequent breaks between isometrics due to fatigue and pain. Took sidelying break between isometric exercises and dynamic stabilization due to back pain. Required minimal cueing during rows to intiate scapular muscles. Pt will benefit from PT services to address deficits in strength, ROM and pain to return to full function at home.    Personal Factors and Comorbidities Age;Comorbidity 3+    Comorbidities DM, HTN, hyperlipidemia, L RTC tear, CVA x 3    Examination-Activity Limitations Caring for  Others;Lift;Reach Overhead;Dressing    Examination-Participation Restrictions Church;Community Activity;Shop;Laundry;Volunteer    Stability/Clinical Decision Making Evolving/Moderate complexity    Rehab Potential Fair    PT Frequency 2x / week    PT Duration 8 weeks    PT Treatment/Interventions ADLs/Self Care Home Management;Aquatic Therapy;Biofeedback;Canalith Repostioning;Cryotherapy;Electrical Stimulation;Iontophoresis 4mg /ml Dexamethasone;Moist Heat;Traction;Ultrasound;DME  Instruction;Gait training;Stair training;Functional mobility training;Therapeutic activities;Therapeutic exercise;Balance training;Neuromuscular re-education;Patient/family education;Manual techniques;Passive range of motion;Dry needling;Vestibular;Joint Manipulations;Spinal Manipulations    PT Next Visit Plan Review HEP for R shoulder, continue range of motion, manual techniques, and strengthening    PT Home Exercise Plan Access Code: [email or web address] (R shoulder) Access Code: [email or web address] (balance and weakness from prior episode);    Consulted and Agree with Plan of Care Patient             Patient will benefit from skilled therapeutic intervention in order to improve the following deficits and impairments:  Decreased strength, Decreased range of motion, Impaired UE functional use, Pain  Visit Diagnosis: Muscle weakness (generalized)  Stiffness of right shoulder, not elsewhere classified  Chronic right shoulder pain     Problem List There are no problems to display for this patient.  This entire session was performed under direct supervision and direction of a licensed therapist/therapist assistant . I have personally read, edited and approve of the note as written.   Lynnea MaizesJason D Huprich PT, DPT, GCS  Verl DickerAbigail Shereena Berquist, Student-PT 08/12/2021, 4:06 PM  [facility] [address] [place], [place], [address] Phone: [phone]   Fax:  [phone]  Name: Berneta LevinsMaria  Plocher MRN: [id number] Date of Birth: 02/04/1961

## 2021-08-17 ENCOUNTER — Other Ambulatory Visit: Payer: Self-pay

## 2021-08-17 ENCOUNTER — Ambulatory Visit: Payer: Medicare Other

## 2021-08-17 DIAGNOSIS — M6281 Muscle weakness (generalized): Secondary | ICD-10-CM | POA: Diagnosis not present

## 2021-08-17 DIAGNOSIS — M25611 Stiffness of right shoulder, not elsewhere classified: Secondary | ICD-10-CM

## 2021-08-17 DIAGNOSIS — M25511 Pain in right shoulder: Secondary | ICD-10-CM

## 2021-08-17 DIAGNOSIS — G8929 Other chronic pain: Secondary | ICD-10-CM

## 2021-08-17 NOTE — Therapy (Addendum)
[facility] [address] [place], [place], [address] Phone: [phone]   Fax:  [phone]  Physical Therapy Treatment  Patient Details  Name: Anna LevinsMaria Mally MRN: [id number] Date of Birth: 06-11-1961 No data recorded  Encounter Date: 08/17/2021   PT End of Session - 08/17/21 0902     Visit Number 8    Number of Visits 17    Date for PT Re-Evaluation 09/15/21    Authorization Type eval: 07/21/21    Authorization Time Period Medicare primary; medicaid    PT Start Time 0800    PT Stop Time 0842    PT Time Calculation (min) 42 min    Activity Tolerance Patient tolerated treatment well    Behavior During Therapy [facility] for tasks assessed/performed              Past Medical History:  Diagnosis Date   Diabetes mellitus without complication ([facility])    High cholesterol    Hypertension     History reviewed. No pertinent surgical history.  There were no vitals filed for this visit.   Subjective Assessment - 08/17/21 0805     Subjective Pt states that pain has been up and down throughout the weekend. Yesterday was bad it felt like the pain was going to the bone. Pt reports pain is better today but still present. No new soreness from treatment.    Pertinent History [place], she fell to the right and struck the wall with her RUE. She had immediate pain in her R shoulder. The pain slowly improved on anti-inflammatories however persisted until she eventually went to [facility]. 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 She was previously seeing physical therapy for balance, dizziness, and L sided weakness s/p CVA x 3 however would like to address her R shoulder pain and weakness at this time. She has a return visit with orthopedics on 08/04/21.    Limitations Lifting    Diagnostic tests See history     Patient Stated Goals Improved R shoulder strength, range of motion, and pain                TREATMENT   Ther-ex  Moist heat pack applied to R shoulder in supine during interval history x 5 minutes Supine R shoulder flexion with bodyweight 2 x 10 Supine R shoulder abduction with bodyweight x 10 then progressed to L sidelying abduction with bodyweight x 10 Supine R shoulder ER with bodyweight x 10 then progressed to L sidelying ER with bodyweight x 10 Seated green banded rows 2 x 10   Dynamic stabilization seated 5 x 10s hold    Manual Therapy  Gentle right shoulder passive range of motion into flexion, abduction, and external rotation R shoulder GH A/P mobilizations at neutral grade I-II, 30s/bout x 2 bouts R shoulder GH inferior mobilizations at 90 abduction grade I-II, 30s/bout x 2 bouts R shoulder GH A/P mobilizations at 45 abduction and pain-free end range external rotation, 30s/bout x 2 bouts   Pt educated throughout session about proper posture and technique with exercises. Improved exercise technique, movement at target joints, use of target muscles after min to mod verbal, visual, tactile cues.    Pt demonstrated excellent motivation during session today. PROM and AROM without pain has improved since last session. Pt able to progress from isometrics to bodyweight  flexion, abduction and ER. Reported grinding during exercises, but no pain with the grinding. Pt able to complete session with minimal breaks between exercises. Patient able to tolerate grade II shoulder mobilizations. Patient progressed from bodyweight rows to banded rows and required minimal cueing to initiate scapular muscles. Pt will benefit from further PT services to continue to address deficits in strength, ROM and pain to return to full function at home.                    PT Short Term Goals - 07/23/21 0910       PT SHORT TERM GOAL #1   Title Pt will be independent with HEP to improve  strength, range of motion, and pain in order to decrease pain and improve function at home.    Time 4    Period Weeks    Status New    Target Date 08/18/21               PT Long Term Goals - 07/23/21 0911       PT LONG TERM GOAL #1   Title Pt will report worst R shoulder pain as no more than 4/10 in order to demonstrate clinically significant improvement in shoulder pain.    Baseline 07/21/21: worst: 7/10    Time 8    Period Weeks    Status New    Target Date 09/15/21      PT LONG TERM GOAL #2   Title Pt will improve R shoulder FOTO to at least 60 in order to demonstrate improvement in function related to her R shoulder    Baseline 07/21/21: 43    Time 8    Period Weeks    Status New    Target Date 09/15/21      PT LONG TERM GOAL #3   Title Pt will decrease quick DASH score by at least 8% in order to demonstrate clinically significant reduction in disability.    Baseline 07/21/21: 70.5%    Time 8    Period Weeks    Status New    Target Date 09/15/21      PT LONG TERM GOAL #4   Title Pt will increase pain-free strength of R shoulder flexion and abduction by at least 1/2 MMT grade in order to demonstrate improvement in strength and function.    Baseline 07/21/21: 3+/5 for both flexion and abduction    Time 8    Period Weeks    Status New    Target Date 09/15/21                   Plan - 08/17/21 0808     Clinical Impression Statement Pt demonstrated excellent motivation during session today. PROM and AROM without pain has improved since last session. Pt able to progress from isometrics to bodyweight flexion, abduction and ER. Reported grinding during exercises, but no pain with the grinding. Pt able to complete session with minimal breaks between exercises. Patient able to tolerate grade II shoulder mobilizations. Patient progressed from bodyweight rows to banded rows and required minimal cueing to initiate scapular muscles. Pt will benefit from further PT  services to continue to address deficits in strength, ROM and pain to return to full function at home.    Personal Factors and Comorbidities Age;Comorbidity 3+    Comorbidities DM, HTN, hyperlipidemia, L RTC tear, CVA x 3    Examination-Activity Limitations Caring for Others;Lift;Reach Overhead;Dressing    Examination-Participation Restrictions Church;Community Activity;Shop;Laundry;Volunteer  Stability/Clinical Decision Making Evolving/Moderate complexity    Rehab Potential Fair    PT Frequency 2x / week    PT Duration 8 weeks    PT Treatment/Interventions ADLs/Self Care Home Management;Aquatic Therapy;Biofeedback;Canalith Repostioning;Cryotherapy;Electrical Stimulation;Iontophoresis 4mg /ml Dexamethasone;Moist Heat;Traction;Ultrasound;DME Instruction;Gait training;Stair training;Functional mobility training;Therapeutic activities;Therapeutic exercise;Balance training;Neuromuscular re-education;Patient/family education;Manual techniques;Passive range of motion;Dry needling;Vestibular;Joint Manipulations;Spinal Manipulations    PT Next Visit Plan Review HEP for R shoulder, continue range of motion, manual techniques, and strengthening    PT Home Exercise Plan Access Code: [email or web address] (R shoulder) Access Code: [email or web address] (balance and weakness from prior episode);    Consulted and Agree with Plan of Care Patient             Patient will benefit from skilled therapeutic intervention in order to improve the following deficits and impairments:  Decreased strength, Decreased range of motion, Impaired UE functional use, Pain  Visit Diagnosis: Muscle weakness (generalized)  Stiffness of right shoulder, not elsewhere classified  Chronic right shoulder pain     Problem List There are no problems to display for this patient.  This entire session was performed under direct supervision and direction of a licensed therapist/therapist assistant . I have personally read, edited and approve of the note  as written.   PT, DPT, GCS  Julie-Anne Torain SPT Huprich,Jason, PT 08/17/2021, 2:32 PM  [place] [facility] [address] [place], [place], [place] Phone: [phone]   Fax:  [phone]  Name: Draya Felker MRN: Anna Nichols Date of Birth: [birth year]

## 2021-08-19 ENCOUNTER — Ambulatory Visit: Payer: Medicare Other

## 2021-08-19 ENCOUNTER — Other Ambulatory Visit: Payer: Self-pay

## 2021-08-19 DIAGNOSIS — M25611 Stiffness of right shoulder, not elsewhere classified: Secondary | ICD-10-CM

## 2021-08-19 DIAGNOSIS — M6281 Muscle weakness (generalized): Secondary | ICD-10-CM

## 2021-08-19 DIAGNOSIS — G8929 Other chronic pain: Secondary | ICD-10-CM

## 2021-08-19 NOTE — Therapy (Addendum)
[place] [facility] [address] [place], [place], [address] Phone: [phone]   Fax:  [phone]  Physical Therapy Treatment  Patient Details  Name: Emi Lymon MRN: [id number] Date of Birth: 1961-03-11 No data recorded  Encounter Date: 08/19/2021   PT End of Session - 08/19/21 1351     Visit Number 9    Number of Visits 17    Date for PT Re-Evaluation 09/15/21    Authorization Type eval: 07/21/21    Authorization Time Period Medicare primary; medicaid    PT Start Time 1345    PT Stop Time 1432    PT Time Calculation (min) 47 min    Activity Tolerance Patient tolerated treatment well    Behavior During Therapy WFL for tasks assessed/performed              Past Medical History:  Diagnosis Date   Diabetes mellitus without complication (HCC)    High cholesterol    Hypertension     History reviewed. No pertinent surgical history.  There were no vitals filed for this visit.   Subjective Assessment - 08/19/21 1351     Subjective Pt has been active today and reported grinding while cleaning, but no pain. Patient reports no new soreness after last session. Patient reports she has being completing her HEP and her exercises have not causing any pain.    Pertinent History Pt was in [place] in July 2022, she fell to the right and struck the wall with her RUE. She had immediate pain in her R shoulder. The pain slowly improved on anti-inflammatories however persisted until she eventually went to [facility]. 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 She was previously seeing physical therapy for balance, dizziness, and L sided weakness s/p CVA x 3 however would like to address her R shoulder pain and weakness at this time. She has a return visit with orthopedics on 08/04/21.    Limitations Lifting    Diagnostic tests See  history    Patient Stated Goals Improved R shoulder strength, range of motion, and pain    Currently in Pain? No/denies                TREATMENT   Ther-ex  Moist heat pack applied to R shoulder in supine during interval history x 5 minutes Supine R serratus punches with manual resistance from therapist x 10 Supine dynamic stabilization with stabilization at elbow x 30s; Supine R shoulder flexion with bodyweight x 10, 1# dumbbell x 10; Supine bicep curl and triceps extension with therapist providing resistance x 10 each way; Supine R shoulder circles clockwise/counterclockwise at 90 degrees of flexion x 10 each direction L Sidelying abduction bodyweight x 10, 1# dumbbell 2 x 10 L sidelying ER 1# dumbbell 2 x 10   Manual Therapy  Gentle right shoulder passive range of motion into flexion, abduction, and external rotation R shoulder GH A/P mobilizations at neutral grade I-II, 30s/bout x 2 bouts R shoulder GH A/P mobilizations 90 shoulder flexion and 90 elbow flexion 30s/bout x 2 bouts R shoulder GH posterior and Inferior mobilization at end range shoulder flexion grade II-III, 30s/bout x 2 bouts R shoulder GH distraction 30s/bout x 2 bouts; R shoulder GH inferior mobilizations at 90 abduction grade I-II, 30s/bout x 2 bouts R shoulder GH A/P mobilizations at 45 abduction and pain-free end range external rotation  grade I-II, 30s/bout x 2 bouts R shoulder GH inferior mobilization at end range abduction grade I-II, 30s/bout x 2   Pt educated throughout session about proper posture and technique with exercises. Improved exercise technique, movement at target joints, use of target muscles after min to mod verbal, visual, tactile cues.    Pt demonstrated excellent motivation during session today. ROM without pain continues to improve. Pt able to progress from sidelying bodyweight abduction and ER to using #1 dumbbells. Pt denies any pain throughout session just a pinching sensation. Pt  will continue to benefit from further PT services to continue to address deficits in strength, ROM and pain to return to full function at home.                      PT Short Term Goals - 07/23/21 0910       PT SHORT TERM GOAL #1   Title Pt will be independent with HEP to improve strength, range of motion, and pain in order to decrease pain and improve function at home.    Time 4    Period Weeks    Status New    Target Date 08/18/21               PT Long Term Goals - 07/23/21 0911       PT LONG TERM GOAL #1   Title Pt will report worst R shoulder pain as no more than 4/10 in order to demonstrate clinically significant improvement in shoulder pain.    Baseline 07/21/21: worst: 7/10    Time 8    Period Weeks    Status New    Target Date 09/15/21      PT LONG TERM GOAL #2   Title Pt will improve R shoulder FOTO to at least 60 in order to demonstrate improvement in function related to her R shoulder    Baseline 07/21/21: 43    Time 8    Period Weeks    Status New    Target Date 09/15/21      PT LONG TERM GOAL #3   Title Pt will decrease quick DASH score by at least 8% in order to demonstrate clinically significant reduction in disability.    Baseline 07/21/21: 70.5%    Time 8    Period Weeks    Status New    Target Date 09/15/21      PT LONG TERM GOAL #4   Title Pt will increase pain-free strength of R shoulder flexion and abduction by at least 1/2 MMT grade in order to demonstrate improvement in strength and function.    Baseline 07/21/21: 3+/5 for both flexion and abduction    Time 8    Period Weeks    Status New    Target Date 09/15/21                   Plan - 08/19/21 1351     Clinical Impression Statement Pt demonstrated excellent motivation during session today. ROM without pain continues to improve. Pt able to progress from sidelying bodyweight abduction and ER to using #1 dumbbells. Pt denies any pain throughout session just a  pinching sensation. Pt will need updated outcome measures and a progress note during next visit. She will continue to benefit from further PT services to continue to address deficits in strength, ROM and pain to return to full function at home.    Personal Factors and Comorbidities Age;Comorbidity 3+  Comorbidities DM, HTN, hyperlipidemia, L RTC tear, CVA x 3    Examination-Activity Limitations Caring for Others;Lift;Reach Overhead;Dressing    Examination-Participation Restrictions Church;Community Activity;Shop;Laundry;Volunteer    Stability/Clinical Decision Making Evolving/Moderate complexity    Rehab Potential Fair    PT Frequency 2x / week    PT Duration 8 weeks    PT Treatment/Interventions ADLs/Self Care Home Management;Aquatic Therapy;Biofeedback;Canalith Repostioning;Cryotherapy;Electrical Stimulation;Iontophoresis 4mg /ml Dexamethasone;Moist Heat;Traction;Ultrasound;DME Instruction;Gait training;Stair training;Functional mobility training;Therapeutic activities;Therapeutic exercise;Balance training;Neuromuscular re-education;Patient/family education;Manual techniques;Passive range of motion;Dry needling;Vestibular;Joint Manipulations;Spinal Manipulations    PT Next Visit Plan Update outcome measures, goals, and recertification; Review HEP for R shoulder, continue range of motion, manual techniques, and strengthening    PT Home Exercise Plan Access Code: [email or web address] (R shoulder) Access Code: [email or web address] (balance and weakness from prior episode);    Consulted and Agree with Plan of Care Patient             Patient will benefit from skilled therapeutic intervention in order to improve the following deficits and impairments:  Decreased strength, Decreased range of motion, Impaired UE functional use, Pain  Visit Diagnosis: Muscle weakness (generalized)  Stiffness of right shoulder, not elsewhere classified  Chronic right shoulder pain     Problem List There are no problems to display  for this patient.   Lynnea MaizesJason D Huprich PT, DPT, GCS  [facility] [address] [place], [place], [address] Phone: [phone]   Fax:  [phone]  Name: Berneta LevinsMaria Brickel MRN: [id number] Date of Birth: 28-Oct-1960

## 2021-08-24 ENCOUNTER — Ambulatory Visit: Payer: Medicare Other

## 2021-08-24 ENCOUNTER — Other Ambulatory Visit: Payer: Self-pay

## 2021-08-24 DIAGNOSIS — M25611 Stiffness of right shoulder, not elsewhere classified: Secondary | ICD-10-CM

## 2021-08-24 DIAGNOSIS — G8929 Other chronic pain: Secondary | ICD-10-CM

## 2021-08-24 DIAGNOSIS — M6281 Muscle weakness (generalized): Secondary | ICD-10-CM | POA: Diagnosis not present

## 2021-08-24 NOTE — Therapy (Signed)
[facility] [address] [place], [place], [address] Phone: [phone]   Fax:  [phone]  Physical Therapy Progress Note/Treatment   Dates of reporting period  07/21/21   to   08/24/21  Patient Details  Name: Anna LevinsMaria Hornik MRN: [id number] Date of Birth: [birth year] No data recorded  Encounter Date: 08/24/2021   PT End of Session - 08/24/21 0802     Visit Number 10    Number of Visits 17    Date for PT Re-Evaluation 09/15/21    Authorization Type eval: 07/21/21    Authorization Time Period Medicare primary; medicaid    PT Start Time 0758    PT Stop Time 0845    PT Time Calculation (min) 47 min    Activity Tolerance Patient tolerated treatment well    Behavior During Therapy [facility] for tasks assessed/performed              Past Medical History:  Diagnosis Date   Diabetes mellitus without complication (HCC)    High cholesterol    Hypertension     History reviewed. No pertinent surgical history.  There were no vitals filed for this visit.   Subjective Assessment - 08/24/21 0800     Subjective Pt has been doing well since the last therapy session. Her R shoulder pain has been improving lately and she feels like she is making forward progress. "Very slight" pain in R shoulder upon arrival. Patient reports she has being completing her HEP.    Pertinent History [place], she fell to the right and struck the wall with her RUE. She had immediate pain in her R shoulder. The pain slowly improved on anti-inflammatories however persisted until she eventually went to [facility]. 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 She was previously seeing physical therapy for balance, dizziness, and L sided weakness s/p CVA x 3 however would like to address her R shoulder pain and weakness at this time.  She has a return visit with orthopedics on 08/04/21.    Limitations Lifting    Diagnostic tests See history    Patient Stated Goals Improved R shoulder strength, range of motion, and pain                TREATMENT   Ther-ex  Moist heat pack applied to R shoulder in supine during interval history x 5 minutes Supine R shoulder bodyweight flexion x 10, 2# dumbbell x 10 Supine bicep curl and triceps extension with therapist providing resistance x 10 each way; Supine R shoulder circles clockwise/counterclockwise at 90 degrees of flexion with 2# dumbbells x 10 each direction Supine R shoulder serratus punch with 2# dumbbell x 10; L Sidelying abduction 2# dumbbell x 10 L sidelying ER 2# dumbbell x 10 Updated outcome measures and goals with patient (see goal section);   Manual Therapy  Gentle right shoulder passive range of motion into flexion, abduction, and external rotation R shoulder GH A/P mobilizations at neutral grade I-II, 30s/bout x 2 bouts R shoulder GH inferior mobilizations at 90 abduction grade I-II, 30s/bout x 2 bouts R shoulder GH A/P mobilizations at 45 abduction and pain-free end range external rotation grade I-II, 30s/bout x 2 bouts R shoulder GH inferior mobilization at end range abduction grade I-II, 30s/bout x 2 R shoulder GH posterior and Inferior mobilization at end range shoulder  flexion grade II-III, 30s/bout x 2 bouts R shoulder GH distraction 30s/bout x 2 bouts; R shoulder scapular mobilizations circumduction 30s/bout x 2 bouts   Pt educated throughout session about proper posture and technique with exercises. Improved exercise technique, movement at target joints, use of target muscles after min to mod verbal, visual, tactile cues.    Updated outcome measures and goals with patient during session today. Her R shoulder flexion and abduction strength has improved to 4/5. She continues to report high levels of pain when her R shoulder hurts however pain is less  frequent and for shorter duration than when she started therapy. Her FOTO scored improved to 68 and her QuickDASH dropped to 13.6% both of which indicate significant improvement in her R shoulder. Continued to progress R shoulder strengthening increasing dumbbells to 2 pounds. Pt denies any pain throughout session. Pt will continue to benefit from further PT services to continue to address deficits in strength, ROM and pain to return to full function at home.                      PT Short Term Goals - 08/24/21 0803       PT SHORT TERM GOAL #1   Title Pt will be independent with HEP to improve strength, range of motion, and pain in order to decrease pain and improve function at home.    Time 4    Period Weeks    Status On-going    Target Date 08/18/21               PT Long Term Goals - 08/24/21 0804       PT LONG TERM GOAL #1   Title Pt will report worst R shoulder pain as no more than 4/10 in order to demonstrate clinically significant improvement in shoulder pain.    Baseline 07/21/21: worst: 7/10; 08/24/21: 8/10 (short duration, pain subsides quicker);    Time 8    Period Weeks    Status On-going    Target Date 09/15/21      PT LONG TERM GOAL #2   Title Pt will improve R shoulder FOTO to at least 60 in order to demonstrate improvement in function related to her R shoulder    Baseline 07/21/21: 43 08/24/21: 68    Time 8    Period Weeks    Status Achieved    Target Date --      PT LONG TERM GOAL #3   Title Pt will decrease quick DASH score by at least 8% in order to demonstrate clinically significant reduction in disability.    Baseline 07/21/21: 70.5% 08/24/21: 13.6%    Time 8    Period Weeks    Status Achieved    Target Date 09/15/21      PT LONG TERM GOAL #4   Title Pt will increase pain-free strength of R shoulder flexion and abduction to at least 4+/5 in order to demonstrate improvement in strength and function when using R shoulder    Baseline  07/21/21: 3+/5 for both flexion and abduction; 08/24/21: 4/5 for both flexion and abduction    Time 8    Period Weeks    Status Revised    Target Date 09/15/21                   Plan - 08/24/21 0803     Clinical Impression Statement Updated outcome measures and goals with patient during session today. Her R shoulder  flexion and abduction strength has improved to 4/5. She continues to report high levels of pain when her R shoulder hurts however pain is less frequent and for shorter duration than when she started therapy. Her FOTO scored improved to 68 and her QuickDASH dropped to 13.6% both of which indicate significant improvement in her R shoulder. Continued to progress R shoulder strengthening increasing dumbbells to 2 pounds. Pt denies any pain throughout session. Pt will continue to benefit from further PT services to continue to address deficits in strength, ROM and pain to return to full function at home.    Personal Factors and Comorbidities Age;Comorbidity 3+    Comorbidities DM, HTN, hyperlipidemia, L RTC tear, CVA x 3    Examination-Activity Limitations Caring for Others;Lift;Reach Overhead;Dressing    Examination-Participation Restrictions Church;Community Activity;Shop;Laundry;Volunteer    Stability/Clinical Decision Making Evolving/Moderate complexity    Rehab Potential Fair    PT Frequency 2x / week    PT Duration 8 weeks    PT Treatment/Interventions ADLs/Self Care Home Management;Aquatic Therapy;Biofeedback;Canalith Repostioning;Cryotherapy;Electrical Stimulation;Iontophoresis 4mg /ml Dexamethasone;Moist Heat;Traction;Ultrasound;DME Instruction;Gait training;Stair training;Functional mobility training;Therapeutic activities;Therapeutic exercise;Balance training;Neuromuscular re-education;Patient/family education;Manual techniques;Passive range of motion;Dry needling;Vestibular;Joint Manipulations;Spinal Manipulations    PT Next Visit Plan Review HEP for R shoulder, continue  range of motion, manual techniques, and strengthening    PT Home Exercise Plan Access Code: [email or web address] (R shoulder) Access Code: [email or web address] (balance and weakness from prior episode);    Consulted and Agree with Plan of Care Patient             Patient will benefit from skilled therapeutic intervention in order to improve the following deficits and impairments:  Decreased strength, Decreased range of motion, Impaired UE functional use, Pain  Visit Diagnosis: Muscle weakness (generalized)  Stiffness of right shoulder, not elsewhere classified  Chronic right shoulder pain     Problem List There are no problems to display for this patient.   PT, DPT, GCS  [facility] [address] [place], [place], [place] Phone: [phone]   Fax:  [phone]  Name: Drew Herman MRN: Anna Nichols Date of Birth: 1961/04/28

## 2021-08-26 ENCOUNTER — Ambulatory Visit: Payer: Medicare Other

## 2021-08-27 ENCOUNTER — Other Ambulatory Visit: Payer: Self-pay

## 2021-08-27 ENCOUNTER — Ambulatory Visit: Payer: Medicare Other

## 2021-08-27 DIAGNOSIS — M6281 Muscle weakness (generalized): Secondary | ICD-10-CM

## 2021-08-27 DIAGNOSIS — G8929 Other chronic pain: Secondary | ICD-10-CM

## 2021-08-27 DIAGNOSIS — M25611 Stiffness of right shoulder, not elsewhere classified: Secondary | ICD-10-CM

## 2021-08-27 NOTE — Patient Instructions (Signed)
Access Code: [email or web address] URL: [email or web address] Date: 08/27/2021 Prepared by: Ria Comment  Exercises Isometric Shoulder Flexion at Wall (Mirrored) - 1 x daily - 7 x weekly - 1 sets - 10 reps - 5s hold Isometric Shoulder Abduction at Wall (Mirrored) - 1 x daily - 7 x weekly - 1 sets - 10 reps - 5s hold Isometric Shoulder External Rotation at Wall (Mirrored) - 1 x daily - 7 x weekly - 1 sets - 10 reps - 5s hold Standing Isometric Shoulder Internal Rotation at Doorway - 1 x daily - 7 x weekly - 1 sets - 10 reps - 5s hold Seated Shoulder Row with Anchored Resistance - 1 x daily - 7 x weekly - 2 sets - 10 reps - 3s hold Dynamic Hug with Resistance - 1 x daily - 7 x weekly - 2 sets - 10 reps - 3s hold

## 2021-08-27 NOTE — Therapy (Signed)
[facility] [address] [place], [place], [address] Phone: [phone]   Fax:  [phone]  Physical Therapy Treatment   Patient Details  Name: Anna Nichols MRN: [id number] Date of Birth: [birth year] No data recorded  Encounter Date: 08/27/2021   PT End of Session - 08/27/21 0954     Visit Number 11    Number of Visits 17    Date for PT Re-Evaluation 09/15/21    Authorization Type eval: 07/21/21    Authorization Time Period Medicare primary; medicaid    PT Start Time 1005    PT Stop Time 1050    PT Time Calculation (min) 45 min    Activity Tolerance Patient tolerated treatment well    Behavior During Therapy WFL for tasks assessed/performed               Past Medical History:  Diagnosis Date   Diabetes mellitus without complication (HCC)    High cholesterol    Hypertension     History reviewed. No pertinent surgical history.  There were no vitals filed for this visit.   Subjective Assessment - 08/27/21 0953     Subjective Pt has been doing well since the last therapy session. Her R shoulder pain continues to improve. She reports 4/10 R shoulder upon arrival. Patient reports she has being completing her HEP. After the last therapy session she was able to work out in her yard with minimal pain.    Pertinent History Pt was in [place] in July 2022, she fell to the right and struck the wall with her RUE. She had immediate pain in her R shoulder. The pain slowly improved on anti-inflammatories however persisted until she eventually went to [facility]. 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 She was previously seeing physical therapy for balance, dizziness, and L sided weakness s/p CVA x 3 however would like to address her R shoulder pain and weakness at this time. She has a return visit with orthopedics  on 08/04/21.    Limitations Lifting    Diagnostic tests See history    Patient Stated Goals Improved R shoulder strength, range of motion, and pain                   TREATMENT   Ther-ex  Moist heat pack applied to R shoulder in supine during interval history x 5 minutes Seated R shoulder flexion with 1# dumbbell (DB) 2 x 10; Seated R shoulder scaption 1# DB 2 x 10; Supine R bicep curls with 1# DB 2 x 10; Seated dynamic hugs with red tband 2 x 10; Seated R tricep extension with anchored red tband 2 x 10; Standing R shoulder ER and IR with anchored red tband 2 x 10 each; Standing R shoulder ball circles at shoulder height x 10 clockwise, discontinued secondary to pain; Standing R shoulder serratus ball press into wall x 10, no pain;   Manual Therapy  Gentle right shoulder passive range of motion into flexion, abduction, and external rotation R shoulder GH A/P mobilizations at neutral grade I-II, 30s/bout x 2 bouts R shoulder GH inferior mobilizations at 90 abduction grade I-II, 30s/bout x 2 bouts R shoulder GH A/P mobilizations at 45 abduction and pain-free end range external rotation grade I-II, 30s/bout x 2 bouts R shoulder GH inferior mobilization at end range abduction grade I-II, 30s/bout x 2  R shoulder GH posterior and Inferior mobilization at end range shoulder flexion grade II-III, 30s/bout x 2 bouts R shoulder GH distraction 30s/bout x 2 bouts; R shoulder scapular mobilizations circumduction 30s/bout x 2 bouts   Pt educated throughout session about proper posture and technique with exercises. Improved exercise technique, movement at target joints, use of target muscles after min to mod verbal, visual, tactile cues.    Continued to progress R shoulder strengthening against gravity in sitting. She denies any pain with gravity resisted exercises. Also progressed to standing concentric ER and IR strengthening with theraband resistance. Performed wall circles with ball  however pt reports pain so discontinued. Updated HEP. Pt will continue to benefit from further PT services to continue to address deficits in strength, ROM and pain to return to full function at home.                      PT Short Term Goals - 08/24/21 0803       PT SHORT TERM GOAL #1   Title Pt will be independent with HEP to improve strength, range of motion, and pain in order to decrease pain and improve function at home.    Time 4    Period Weeks    Status On-going    Target Date 08/18/21               PT Long Term Goals - 08/24/21 0804       PT LONG TERM GOAL #1   Title Pt will report worst R shoulder pain as no more than 4/10 in order to demonstrate clinically significant improvement in shoulder pain.    Baseline 07/21/21: worst: 7/10; 08/24/21: 8/10 (short duration, pain subsides quicker);    Time 8    Period Weeks    Status On-going    Target Date 09/15/21      PT LONG TERM GOAL #2   Title Pt will improve R shoulder FOTO to at least 60 in order to demonstrate improvement in function related to her R shoulder    Baseline 07/21/21: 43 08/24/21: 68    Time 8    Period Weeks    Status Achieved    Target Date --      PT LONG TERM GOAL #3   Title Pt will decrease quick DASH score by at least 8% in order to demonstrate clinically significant reduction in disability.    Baseline 07/21/21: 70.5% 08/24/21: 13.6%    Time 8    Period Weeks    Status Achieved    Target Date 09/15/21      PT LONG TERM GOAL #4   Title Pt will increase pain-free strength of R shoulder flexion and abduction to at least 4+/5 in order to demonstrate improvement in strength and function when using R shoulder    Baseline 07/21/21: 3+/5 for both flexion and abduction; 08/24/21: 4/5 for both flexion and abduction    Time 8    Period Weeks    Status Revised    Target Date 09/15/21                   Plan - 08/27/21 0954     Clinical Impression Statement Continued to  progress R shoulder strengthening against gravity in sitting. She denies any pain with gravity resisted exercises. Also progressed to standing concentric ER and IR strengthening with theraband resistance. Performed wall circles with ball however pt reports pain so discontinued. Updated HEP. Pt will continue  to benefit from further PT services to continue to address deficits in strength, ROM and pain to return to full function at home.    Personal Factors and Comorbidities Age;Comorbidity 3+    Comorbidities DM, HTN, hyperlipidemia, L RTC tear, CVA x 3    Examination-Activity Limitations Caring for Others;Lift;Reach Overhead;Dressing    Examination-Participation Restrictions Church;Community Activity;Shop;Laundry;Volunteer    Stability/Clinical Decision Making Evolving/Moderate complexity    Rehab Potential Fair    PT Frequency 2x / week    PT Duration 8 weeks    PT Treatment/Interventions ADLs/Self Care Home Management;Aquatic Therapy;Biofeedback;Canalith Repostioning;Cryotherapy;Electrical Stimulation;Iontophoresis 4mg /ml Dexamethasone;Moist Heat;Traction;Ultrasound;DME Instruction;Gait training;Stair training;Functional mobility training;Therapeutic activities;Therapeutic exercise;Balance training;Neuromuscular re-education;Patient/family education;Manual techniques;Passive range of motion;Dry needling;Vestibular;Joint Manipulations;Spinal Manipulations    PT Next Visit Plan Review HEP for R shoulder, continue range of motion, manual techniques, and strengthening    PT Home Exercise Plan Access Code: [email or web address] (R shoulder) Access Code: [email or web address] (balance and weakness from prior episode);    Consulted and Agree with Plan of Care Patient              Patient will benefit from skilled therapeutic intervention in order to improve the following deficits and impairments:  Decreased strength, Decreased range of motion, Impaired UE functional use, Pain  Visit Diagnosis: Muscle weakness  (generalized)  Stiffness of right shoulder, not elsewhere classified  Chronic right shoulder pain     Problem List There are no problems to display for this patient.   Lynnea MaizesJason D Tysheena Ginzburg PT, DPT, GCS  [facility] [address] [place], [place], [address] Phone: [phone]   Fax:  [phone]  Name: Anna LevinsMaria Nichols MRN: [id number] Date of Birth: 12/02/1960

## 2021-08-31 ENCOUNTER — Ambulatory Visit: Payer: Medicare Other

## 2021-08-31 ENCOUNTER — Other Ambulatory Visit: Payer: Self-pay

## 2021-08-31 DIAGNOSIS — M6281 Muscle weakness (generalized): Secondary | ICD-10-CM | POA: Diagnosis not present

## 2021-08-31 DIAGNOSIS — G8929 Other chronic pain: Secondary | ICD-10-CM

## 2021-08-31 DIAGNOSIS — M25611 Stiffness of right shoulder, not elsewhere classified: Secondary | ICD-10-CM

## 2021-08-31 NOTE — Therapy (Signed)
[facility] [address] [place], [place], [address] Phone: [phone]   Fax:  [phone]  Physical Therapy Treatment   Patient Details  Name: Anna LevinsMaria Nichols MRN: [id number] Date of Birth: 1960-09-13 No data recorded  Encounter Date: 08/31/2021   PT End of Session - 08/31/21 1427     Visit Number 12    Number of Visits 17    Date for PT Re-Evaluation 09/15/21    Authorization Type eval: 07/21/21    Authorization Time Period Medicare primary; medicaid    PT Start Time 1407    PT Stop Time 1452    PT Time Calculation (min) 45 min    Activity Tolerance Patient tolerated treatment well    Behavior During Therapy WFL for tasks assessed/performed               Past Medical History:  Diagnosis Date   Diabetes mellitus without complication (HCC)    High cholesterol    Hypertension     History reviewed. No pertinent surgical history.  There were no vitals filed for this visit.   Subjective Assessment - 08/31/21 1427     Subjective Pt has been doing well since the last therapy session. Her R shoulder pain continues to improve. She reports 3/10 R shoulder pain upon arrival. Patient reports she has being completing her HEP.    Pertinent History [place], she fell to the right and struck the wall with her RUE. She had immediate pain in her R shoulder. The pain slowly improved on anti-inflammatories however persisted until she eventually went to [facility]. 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 She was previously seeing physical therapy for balance, dizziness, and L sided weakness s/p CVA x 3 however would like to address her R shoulder pain and weakness at this time. She has a return visit with orthopedics on 08/04/21.    Limitations Lifting    Diagnostic tests See history    Patient  Stated Goals Improved R shoulder strength, range of motion, and pain                   TREATMENT   Ther-ex  Moist heat pack applied to R shoulder in supine during interval history x 5 minutes Seated R shoulder flexion with 2# dumbbell (DB) 2 x 10; Seated R shoulder scaption 2# DB 2 x 10; Seated R shoulder black body blade at 90 flexion x 30s single hand (too difficult), double hand x 30s; Standing R shoulder black body blade at side for abduction/adduction x 30s; Standing R shoulder black body blade at side for elbow flexion/extension x 30s; Seated R bicep curls with 2# DB 2 x 10; Seated R tricep extension with anchored red tband 2 x 10; Nautilus lat pull down with bar 30# 2 x 10;  Manual Therapy  Gentle right shoulder passive range of motion into flexion, abduction, and external rotation R shoulder GH A/P mobilizations at neutral grade I-II, 30s/bout x 2 bouts R shoulder GH inferior mobilizations at 90 abduction grade I-II, 30s/bout x 2 bouts R shoulder GH A/P mobilizations at 45 abduction and pain-free end range external rotation grade I-II, 30s/bout x 2 bouts R shoulder GH inferior mobilization at end range abduction grade I-II, 30s/bout x 2 R shoulder GH posterior and Inferior mobilization at end range shoulder flexion grade  II-III, 30s/bout x 2 bouts R shoulder GH distraction 30s/bout x 2 bouts; R shoulder scapular mobilizations circumduction 30s/bout x 2 bouts   Pt educated throughout session about proper posture and technique with exercises. Improved exercise technique, movement at target joints, use of target muscles after min to mod verbal, visual, tactile cues.    Continued to progress R shoulder strengthening against gravity in sitting. She is able to increase to 2# dumbbells today without any pain. Also progressed to utilizing Nautilus machine and body blade for strengthening. Pt will continue to benefit from further PT services to continue to address deficits in  strength, ROM and pain to return to full function at home.                      PT Short Term Goals - 08/24/21 0803       PT SHORT TERM GOAL #1   Title Pt will be independent with HEP to improve strength, range of motion, and pain in order to decrease pain and improve function at home.    Time 4    Period Weeks    Status On-going    Target Date 08/18/21               PT Long Term Goals - 08/24/21 0804       PT LONG TERM GOAL #1   Title Pt will report worst R shoulder pain as no more than 4/10 in order to demonstrate clinically significant improvement in shoulder pain.    Baseline 07/21/21: worst: 7/10; 08/24/21: 8/10 (short duration, pain subsides quicker);    Time 8    Period Weeks    Status On-going    Target Date 09/15/21      PT LONG TERM GOAL #2   Title Pt will improve R shoulder FOTO to at least 60 in order to demonstrate improvement in function related to her R shoulder    Baseline 07/21/21: 43 08/24/21: 68    Time 8    Period Weeks    Status Achieved    Target Date --      PT LONG TERM GOAL #3   Title Pt will decrease quick DASH score by at least 8% in order to demonstrate clinically significant reduction in disability.    Baseline 07/21/21: 70.5% 08/24/21: 13.6%    Time 8    Period Weeks    Status Achieved    Target Date 09/15/21      PT LONG TERM GOAL #4   Title Pt will increase pain-free strength of R shoulder flexion and abduction to at least 4+/5 in order to demonstrate improvement in strength and function when using R shoulder    Baseline 07/21/21: 3+/5 for both flexion and abduction; 08/24/21: 4/5 for both flexion and abduction    Time 8    Period Weeks    Status Revised    Target Date 09/15/21                   Plan - 08/31/21 1428     Clinical Impression Statement Continued to progress R shoulder strengthening against gravity in sitting. She is able to increase to 2# dumbbells today without any pain. Also progressed to  utilizing Nautilus machine and body blade for strengthening. Pt will continue to benefit from further PT services to continue to address deficits in strength, ROM and pain to return to full function at home.    Personal Factors and Comorbidities Age;Comorbidity 3+  Comorbidities DM, HTN, hyperlipidemia, L RTC tear, CVA x 3    Examination-Activity Limitations Caring for Others;Lift;Reach Overhead;Dressing    Examination-Participation Restrictions Church;Community Activity;Shop;Laundry;Volunteer    Stability/Clinical Decision Making Evolving/Moderate complexity    Rehab Potential Fair    PT Frequency 2x / week    PT Duration 8 weeks    PT Treatment/Interventions ADLs/Self Care Home Management;Aquatic Therapy;Biofeedback;Canalith Repostioning;Cryotherapy;Electrical Stimulation;Iontophoresis 4mg /ml Dexamethasone;Moist Heat;Traction;Ultrasound;DME Instruction;Gait training;Stair training;Functional mobility training;Therapeutic activities;Therapeutic exercise;Balance training;Neuromuscular re-education;Patient/family education;Manual techniques;Passive range of motion;Dry needling;Vestibular;Joint Manipulations;Spinal Manipulations    PT Next Visit Plan Review HEP for R shoulder, continue range of motion, manual techniques, and strengthening    PT Home Exercise Plan Access Code: [email or web address] (R shoulder) Access Code: [email or web address] (balance and weakness from prior episode);    Consulted and Agree with Plan of Care Patient              Patient will benefit from skilled therapeutic intervention in order to improve the following deficits and impairments:  Decreased strength, Decreased range of motion, Impaired UE functional use, Pain  Visit Diagnosis: Muscle weakness (generalized)  Stiffness of right shoulder, not elsewhere classified  Chronic right shoulder pain     Problem List There are no problems to display for this patient.   PT, DPT, GCS   [facility] [address] [place], [place], [place] Phone: [phone]   Fax:  [phone]  Name: Anna Nichols MRN: Anna Nichols Date of Birth: 08/28/1960

## 2021-09-02 ENCOUNTER — Ambulatory Visit: Payer: Medicare Other

## 2021-09-03 ENCOUNTER — Other Ambulatory Visit: Payer: Self-pay

## 2021-09-03 ENCOUNTER — Ambulatory Visit: Payer: Medicare Other | Attending: Neurology

## 2021-09-03 DIAGNOSIS — G8929 Other chronic pain: Secondary | ICD-10-CM | POA: Insufficient documentation

## 2021-09-03 DIAGNOSIS — M25611 Stiffness of right shoulder, not elsewhere classified: Secondary | ICD-10-CM | POA: Insufficient documentation

## 2021-09-03 DIAGNOSIS — M25511 Pain in right shoulder: Secondary | ICD-10-CM | POA: Diagnosis present

## 2021-09-03 DIAGNOSIS — M6281 Muscle weakness (generalized): Secondary | ICD-10-CM | POA: Insufficient documentation

## 2021-09-03 NOTE — Therapy (Signed)
[facility] [address] [place], [place], [address] Phone: [phone]   Fax:  [phone]  Physical Therapy Treatment   Patient Details  Name: Anna Nichols MRN: [id number] Date of Birth: 1960-10-14 No data recorded  Encounter Date: 09/03/2021   PT End of Session - 09/03/21 1105     Visit Number 13    Number of Visits 17    Date for PT Re-Evaluation 09/15/21    Authorization Type eval: 07/21/21    Authorization Time Period Medicare primary; medicaid    PT Start Time 1045    PT Stop Time 1130    PT Time Calculation (min) 45 min    Activity Tolerance Patient tolerated treatment well    Behavior During Therapy WFL for tasks assessed/performed               Past Medical History:  Diagnosis Date   Diabetes mellitus without complication (HCC)    High cholesterol    Hypertension     History reviewed. No pertinent surgical history.  There were no vitals filed for this visit.   Subjective Assessment - 09/03/21 1105     Subjective Pt has been doing well since the last therapy session. She reports 3/10 R shoulder pain upon arrival. She has being completing her HEP.    Pertinent History Pt was in [place] in July 2022, she fell to the right and struck the wall with her RUE. She had immediate pain in her R shoulder. The pain slowly improved on anti-inflammatories however persisted until she eventually went to [facility]. 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 She was previously seeing physical therapy for balance, dizziness, and L sided weakness s/p CVA x 3 however would like to address her R shoulder pain and weakness at this time. She has a return visit with orthopedics on 08/04/21.    Limitations Lifting    Diagnostic tests See history    Patient Stated Goals Improved R shoulder strength, range of motion,  and pain    Currently in Pain? No/denies                   TREATMENT   Ther-ex  Moist heat pack applied to R shoulder in supine during interval history x 5 minutes Seated R shoulder flexion with 2# dumbbell (DB) 2 x 10; Seated R shoulder scaption 2# DB 2 x 10; Plantigrade straight elbow BOSU (round side up) planks on mat table with alternating arm lifts x 10 BUE; Wall push-ups with hands at height height 2 x 10; Wall flexion slides with yellow tband around wrists and pt maintaining consistent distance 2 x 10; Seated R bicep curls with 3# DB 2 x 15;   Manual Therapy  Gentle right shoulder passive range of motion into flexion, abduction, and external rotation R shoulder GH A/P mobilizations at neutral grade I-II, 30s/bout x 2 bouts R shoulder GH inferior mobilizations at 90 abduction grade I-II, 30s/bout x 2 bouts R shoulder GH A/P mobilizations at 45 abduction and pain-free end range external rotation grade I-II, 30s/bout x 2 bouts R shoulder GH inferior mobilization at end range abduction grade I-II, 30s/bout x 2 R shoulder GH posterior and Inferior mobilization at end range shoulder flexion grade II-III, 30s/bout x 2 bouts R shoulder GH distraction 30s/bout x 2 bouts; R shoulder scapular mobilizations circumduction 30s/bout x 2 bouts  Pt educated throughout session about proper posture and technique with exercises. Improved exercise technique, movement at target joints, use of target muscles after min to mod verbal, visual, tactile cues.    Continued to progress R shoulder strengthening against gravity in sitting. Incorporated some closed chain strengthening as well for R shoulder today as well. Pt issued yellow theraband to perform some forearm wall slides at home. Pt will continue to benefit from further PT services to continue to address deficits in strength, ROM and pain to return to full function at home.                      PT Short Term Goals -  08/24/21 0803       PT SHORT TERM GOAL #1   Title Pt will be independent with HEP to improve strength, range of motion, and pain in order to decrease pain and improve function at home.    Time 4    Period Weeks    Status On-going    Target Date 08/18/21               PT Long Term Goals - 08/24/21 0804       PT LONG TERM GOAL #1   Title Pt will report worst R shoulder pain as no more than 4/10 in order to demonstrate clinically significant improvement in shoulder pain.    Baseline 07/21/21: worst: 7/10; 08/24/21: 8/10 (short duration, pain subsides quicker);    Time 8    Period Weeks    Status On-going    Target Date 09/15/21      PT LONG TERM GOAL #2   Title Pt will improve R shoulder FOTO to at least 60 in order to demonstrate improvement in function related to her R shoulder    Baseline 07/21/21: 43 08/24/21: 68    Time 8    Period Weeks    Status Achieved    Target Date --      PT LONG TERM GOAL #3   Title Pt will decrease quick DASH score by at least 8% in order to demonstrate clinically significant reduction in disability.    Baseline 07/21/21: 70.5% 08/24/21: 13.6%    Time 8    Period Weeks    Status Achieved    Target Date 09/15/21      PT LONG TERM GOAL #4   Title Pt will increase pain-free strength of R shoulder flexion and abduction to at least 4+/5 in order to demonstrate improvement in strength and function when using R shoulder    Baseline 07/21/21: 3+/5 for both flexion and abduction; 08/24/21: 4/5 for both flexion and abduction    Time 8    Period Weeks    Status Revised    Target Date 09/15/21                   Plan - 09/03/21 1105     Clinical Impression Statement Continued to progress R shoulder strengthening against gravity in sitting. Incorporated some closed chain strengthening as well for R shoulder today as well. Pt issued yellow theraband to perform some forearm wall slides at home. Pt will continue to benefit from further PT  services to continue to address deficits in strength, ROM and pain to return to full function at home.    Personal Factors and Comorbidities Age;Comorbidity 3+    Comorbidities DM, HTN, hyperlipidemia, L RTC tear, CVA x 3    Examination-Activity Limitations Caring for Others;Lift;Reach  Overhead;Dressing    Examination-Participation Restrictions Church;Community Activity;Shop;Laundry;Volunteer    Stability/Clinical Decision Making Evolving/Moderate complexity    Rehab Potential Fair    PT Frequency 2x / week    PT Duration 8 weeks    PT Treatment/Interventions ADLs/Self Care Home Management;Aquatic Therapy;Biofeedback;Canalith Repostioning;Cryotherapy;Electrical Stimulation;Iontophoresis 4mg /ml Dexamethasone;Moist Heat;Traction;Ultrasound;DME Instruction;Gait training;Stair training;Functional mobility training;Therapeutic activities;Therapeutic exercise;Balance training;Neuromuscular re-education;Patient/family education;Manual techniques;Passive range of motion;Dry needling;Vestibular;Joint Manipulations;Spinal Manipulations    PT Next Visit Plan Review HEP for R shoulder, continue range of motion, manual techniques, and strengthening    PT Home Exercise Plan Access Code: [email or web address] (R shoulder) Access Code: [email or web address] (balance and weakness from prior episode);    Consulted and Agree with Plan of Care Patient              Patient will benefit from skilled therapeutic intervention in order to improve the following deficits and impairments:  Decreased strength, Decreased range of motion, Impaired UE functional use, Pain  Visit Diagnosis: Muscle weakness (generalized)  Stiffness of right shoulder, not elsewhere classified  Chronic right shoulder pain     Problem List There are no problems to display for this patient.   PT, DPT, GCS  [facility] [address] [place], [place], [place] Phone: [phone]   Fax:   [phone]  Name: Sallyanne Birkhead MRN: Berneta Levins Date of Birth: [birth year]

## 2021-09-06 ENCOUNTER — Other Ambulatory Visit: Payer: Self-pay

## 2021-09-06 ENCOUNTER — Ambulatory Visit: Payer: Medicare Other

## 2021-09-06 DIAGNOSIS — M6281 Muscle weakness (generalized): Secondary | ICD-10-CM | POA: Diagnosis not present

## 2021-09-06 DIAGNOSIS — M25611 Stiffness of right shoulder, not elsewhere classified: Secondary | ICD-10-CM

## 2021-09-06 NOTE — Therapy (Signed)
[facility] [address] [place], [place], [address] Phone: [phone]   Fax:  [phone]  Physical Therapy Treatment   Patient Details  Name: Opaline Reyburn MRN: [id number] Date of Birth: 1961/07/11 No data recorded  Encounter Date: 09/06/2021   PT End of Session - 09/06/21 1504     Visit Number 14    Number of Visits 17    Date for PT Re-Evaluation 09/15/21    Authorization Type eval: 07/21/21    Authorization Time Period Medicare primary; medicaid    PT Start Time 1650    PT Stop Time 1730    PT Time Calculation (min) 40 min    Activity Tolerance Patient tolerated treatment well    Behavior During Therapy WFL for tasks assessed/performed                Past Medical History:  Diagnosis Date   Diabetes mellitus without complication (HCC)    High cholesterol    Hypertension     History reviewed. No pertinent surgical history.  There were no vitals filed for this visit.   Subjective Assessment - 09/06/21 1504     Subjective Pt has been doing well since the last therapy session. She reports 3/10 R shoulder pain upon arrival. She has being completing her HEP. Her shoulder continues to improve. She notices that the R shoulder continues to improve.    Pertinent History Pt was in [place] in July 2022, she fell to the right and struck the wall with her RUE. She had immediate pain in her R shoulder. The pain slowly improved on anti-inflammatories however persisted until she eventually went to [facility]. 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 She was previously seeing physical therapy for balance, dizziness, and L sided weakness s/p CVA x 3 however would like to address her R shoulder pain and weakness at this time. She has a return visit with orthopedics on 08/04/21.    Limitations Lifting    Diagnostic  tests See history    Patient Stated Goals Improved R shoulder strength, range of motion, and pain                   TREATMENT   Ther-ex  Moist heat pack applied to R shoulder in supine during interval history x 5 minutes Supine R shoulder circles clockwise/counterclockwise x 10 each direction; Attempted supine R shoulder flexion with 3# dumbbell but too heavy and causes pain so regressed back to 2#; Supine R shoulder serratus punch with 2# dumbbell 2 x 10; Supine R shoulder flexion with 2# dumbbell x 10; Seated R shoulder flexion with 2# dumbbell 2 x 10; Seated R shoulder overhead press with 2# dumbbell 2 x 10; Seated R shoulder scaption 2# DB 2 x 10;   Manual Therapy  Gentle right shoulder passive range of motion into flexion, abduction, and external rotation R shoulder GH A/P mobilizations at neutral grade I-II, 30s/bout x 2 bouts R shoulder GH inferior mobilizations at 90 abduction grade I-II, 30s/bout x 2 bouts R shoulder GH A/P mobilizations at 45 abduction and pain-free end range external rotation grade I-II, 30s/bout x 2 bouts R shoulder GH inferior mobilization at end range abduction grade I-II, 30s/bout x 2 R shoulder GH posterior and Inferior mobilization at end range shoulder flexion grade II-III, 30s/bout x 2 bouts R shoulder GH distraction 30s/bout  x 2 bouts;   Pt educated throughout session about proper posture and technique with exercises. Improved exercise technique, movement at target joints, use of target muscles after min to mod verbal, visual, tactile cues.    Continued to progress R shoulder strengthening against gravity in sitting. Also continued with mobilizations for pain modulation and her R shoulder ROM continues to improve. Pt will continue to benefit from further PT services to continue to address deficits in strength, ROM and pain to return to full function at home.                      PT Short Term Goals - 08/24/21 0803        PT SHORT TERM GOAL #1   Title Pt will be independent with HEP to improve strength, range of motion, and pain in order to decrease pain and improve function at home.    Time 4    Period Weeks    Status On-going    Target Date 08/18/21               PT Long Term Goals - 08/24/21 0804       PT LONG TERM GOAL #1   Title Pt will report worst R shoulder pain as no more than 4/10 in order to demonstrate clinically significant improvement in shoulder pain.    Baseline 07/21/21: worst: 7/10; 08/24/21: 8/10 (short duration, pain subsides quicker);    Time 8    Period Weeks    Status On-going    Target Date 09/15/21      PT LONG TERM GOAL #2   Title Pt will improve R shoulder FOTO to at least 60 in order to demonstrate improvement in function related to her R shoulder    Baseline 07/21/21: 43 08/24/21: 68    Time 8    Period Weeks    Status Achieved    Target Date --      PT LONG TERM GOAL #3   Title Pt will decrease quick DASH score by at least 8% in order to demonstrate clinically significant reduction in disability.    Baseline 07/21/21: 70.5% 08/24/21: 13.6%    Time 8    Period Weeks    Status Achieved    Target Date 09/15/21      PT LONG TERM GOAL #4   Title Pt will increase pain-free strength of R shoulder flexion and abduction to at least 4+/5 in order to demonstrate improvement in strength and function when using R shoulder    Baseline 07/21/21: 3+/5 for both flexion and abduction; 08/24/21: 4/5 for both flexion and abduction    Time 8    Period Weeks    Status Revised    Target Date 09/15/21                   Plan - 09/06/21 1505     Clinical Impression Statement Continued to progress R shoulder strengthening against gravity in sitting. Also continued with mobilizations for pain modulation and her R shoulder ROM continues to improve. Pt will continue to benefit from further PT services to continue to address deficits in strength, ROM and pain to return  to full function at home.    Personal Factors and Comorbidities Age;Comorbidity 3+    Comorbidities DM, HTN, hyperlipidemia, L RTC tear, CVA x 3    Examination-Activity Limitations Caring for Others;Lift;Reach Overhead;Dressing    Examination-Participation Restrictions Church;Community Activity;Shop;Laundry;Volunteer    Stability/Clinical Decision Making Evolving/Moderate  complexity    Rehab Potential Fair    PT Frequency 2x / week    PT Duration 8 weeks    PT Treatment/Interventions ADLs/Self Care Home Management;Aquatic Therapy;Biofeedback;Canalith Repostioning;Cryotherapy;Electrical Stimulation;Iontophoresis 4mg /ml Dexamethasone;Moist Heat;Traction;Ultrasound;DME Instruction;Gait training;Stair training;Functional mobility training;Therapeutic activities;Therapeutic exercise;Balance training;Neuromuscular re-education;Patient/family education;Manual techniques;Passive range of motion;Dry needling;Vestibular;Joint Manipulations;Spinal Manipulations    PT Next Visit Plan Review HEP for R shoulder, continue range of motion, manual techniques, and strengthening    PT Home Exercise Plan Access Code: [email or web address] (R shoulder) Access Code: [email or web address] (balance and weakness from prior episode);    Consulted and Agree with Plan of Care Patient               Patient will benefit from skilled therapeutic intervention in order to improve the following deficits and impairments:  Decreased strength, Decreased range of motion, Impaired UE functional use, Pain  Visit Diagnosis: Muscle weakness (generalized)  Stiffness of right shoulder, not elsewhere classified     Problem List There are no problems to display for this patient.   PT, DPT, GCS  [facility] [address] [place], [place], [place] Phone: [phone]   Fax:  [phone]  Name: Joelys Staubs MRN: Berneta Levins Date of Birth: 1961-04-29

## 2021-09-08 ENCOUNTER — Other Ambulatory Visit: Payer: Self-pay

## 2021-09-08 ENCOUNTER — Ambulatory Visit: Payer: Medicare Other

## 2021-09-08 DIAGNOSIS — M25611 Stiffness of right shoulder, not elsewhere classified: Secondary | ICD-10-CM

## 2021-09-08 DIAGNOSIS — M6281 Muscle weakness (generalized): Secondary | ICD-10-CM | POA: Diagnosis not present

## 2021-09-08 NOTE — Therapy (Signed)
[facility] [address] [place], [place], [address] Phone: [phone]   Fax:  [phone]  Physical Therapy Treatment   Patient Details  Name: Anna Nichols MRN: [id number] Date of Birth: 1960-10-13 No data recorded  Encounter Date: 09/08/2021   PT End of Session - 09/08/21 0846     Visit Number 15    Number of Visits 17    Date for PT Re-Evaluation 09/15/21    Authorization Type eval: 07/21/21    Authorization Time Period Medicare primary; medicaid    PT Start Time 0848    PT Stop Time 0930    PT Time Calculation (min) 42 min    Activity Tolerance Patient tolerated treatment well    Behavior During Therapy [facility] for tasks assessed/performed                Past Medical History:  Diagnosis Date   Diabetes mellitus without complication (HCC)    High cholesterol    Hypertension     History reviewed. No pertinent surgical history.  There were no vitals filed for this visit.   Subjective Assessment - 09/08/21 0842     Subjective Pt has been doing well since the last therapy session. She had fatigue in her muscles but no pain. She denies shoulder pain upon arrival today. She has been completing her HEP. Her shoulder continues to improve    Pertinent History Pt was in [place] in July 2022, she fell to the right and struck the wall with her RUE. She had immediate pain in her R shoulder. The pain slowly improved on anti-inflammatories however persisted until she eventually went to [facility]. 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 She was previously seeing physical therapy for balance, dizziness, and L sided weakness s/p CVA x 3 however would like to address her R shoulder pain and weakness at this time. She has a return visit with orthopedics on 08/04/21.    Limitations Lifting    Diagnostic tests See  history    Patient Stated Goals Improved R shoulder strength, range of motion, and pain    Currently in Pain? No/denies                   TREATMENT   Ther-ex  Moist heat pack applied to R shoulder in supine during interval history x 5 minutes Supine R shoulder circles clockwise/counterclockwise at 90 flexion with 3# dumbbell (DB) x 10 each direction; Supine R shoulder ABC tracing at 90 degree flexion x 1 bout; Supine R shoulder serratus punch with 3# DB x 15; Supine R shoulder flexion with 2# DB x 15; Supine R shoulder rhythmic stabilization at wrist 2 x 30s; Seated R shoulder flexion with 3# DB from neutral to 45 degrees x 10, x 5; Seated R shoulder scaption 3# DB from neutral to 45 degrees x 10; Wall push ups with hands at head level 2 x 10; Seated R bicep curls with 3# DB x 15; Nautilus lat pull down with individual hand grips 30# 2 x 10, cues for scapular retraction;   Manual Therapy  Gentle right shoulder passive range of motion into flexion, abduction, and external rotation R shoulder GH A/P mobilizations at neutral grade I-II, 30s/bout x 2 bouts R shoulder GH inferior mobilizations at 90 abduction grade I-II, 30s/bout x 2 bouts R shoulder GH A/P mobilizations at 90  abduction and pain-free end range external rotation grade I-II, 30s/bout x 2 bouts R shoulder GH inferior mobilization at end range abduction grade I-II, 30s/bout x 2 R shoulder GH posterior and Inferior mobilization at end range shoulder flexion grade II-III, 30s/bout x 2 bouts R shoulder GH distraction 30s/bout x 2 bouts;   Pt educated throughout session about proper posture and technique with exercises. Improved exercise technique, movement at target joints, use of target muscles after min to mod verbal, visual, tactile cues.    Continued to progress R shoulder strengthening by increasing repetitions as well as weight. Continued with closed chain strengthening with wall push-ups. Also continued with  mobilizations for pain modulation and her R shoulder ROM continues to improve. Overall pt is making excellent progress and will update her goals at the next session. Pt will continue to benefit from further PT services to continue to address deficits in strength, ROM and pain to return to full function at home.                      PT Short Term Goals - 08/24/21 0803       PT SHORT TERM GOAL #1   Title Pt will be independent with HEP to improve strength, range of motion, and pain in order to decrease pain and improve function at home.    Time 4    Period Weeks    Status On-going    Target Date 08/18/21               PT Long Term Goals - 08/24/21 0804       PT LONG TERM GOAL #1   Title Pt will report worst R shoulder pain as no more than 4/10 in order to demonstrate clinically significant improvement in shoulder pain.    Baseline 07/21/21: worst: 7/10; 08/24/21: 8/10 (short duration, pain subsides quicker);    Time 8    Period Weeks    Status On-going    Target Date 09/15/21      PT LONG TERM GOAL #2   Title Pt will improve R shoulder FOTO to at least 60 in order to demonstrate improvement in function related to her R shoulder    Baseline 07/21/21: 43 08/24/21: 68    Time 8    Period Weeks    Status Achieved    Target Date --      PT LONG TERM GOAL #3   Title Pt will decrease quick DASH score by at least 8% in order to demonstrate clinically significant reduction in disability.    Baseline 07/21/21: 70.5% 08/24/21: 13.6%    Time 8    Period Weeks    Status Achieved    Target Date 09/15/21      PT LONG TERM GOAL #4   Title Pt will increase pain-free strength of R shoulder flexion and abduction to at least 4+/5 in order to demonstrate improvement in strength and function when using R shoulder    Baseline 07/21/21: 3+/5 for both flexion and abduction; 08/24/21: 4/5 for both flexion and abduction    Time 8    Period Weeks    Status Revised    Target Date  09/15/21                   Plan - 09/08/21 0919     Clinical Impression Statement Continued to progress R shoulder strengthening by increasing repetitions as well as weight. Continued with closed chain strengthening with wall  push-ups. Also continued with mobilizations for pain modulation and her R shoulder ROM continues to improve. Overall pt is making excellent progress and will update her goals at the next session. Pt will continue to benefit from further PT services to continue to address deficits in strength, ROM and pain to return to full function at home.    Personal Factors and Comorbidities Age;Comorbidity 3+    Comorbidities DM, HTN, hyperlipidemia, L RTC tear, CVA x 3    Examination-Activity Limitations Caring for Others;Lift;Reach Overhead;Dressing    Examination-Participation Restrictions Church;Community Activity;Shop;Laundry;Volunteer    Stability/Clinical Decision Making Evolving/Moderate complexity    Rehab Potential Fair    PT Frequency 2x / week    PT Duration 8 weeks    PT Treatment/Interventions ADLs/Self Care Home Management;Aquatic Therapy;Biofeedback;Canalith Repostioning;Cryotherapy;Electrical Stimulation;Iontophoresis 4mg /ml Dexamethasone;Moist Heat;Traction;Ultrasound;DME Instruction;Gait training;Stair training;Functional mobility training;Therapeutic activities;Therapeutic exercise;Balance training;Neuromuscular re-education;Patient/family education;Manual techniques;Passive range of motion;Dry needling;Vestibular;Joint Manipulations;Spinal Manipulations    PT Next Visit Plan Update outcome measures, goals, and recertification, Review HEP for R shoulder, continue range of motion, manual techniques, and strengthening    PT Home Exercise Plan Access Code: [email or web address] (R shoulder) Access Code: [email or web address] (balance and weakness from prior episode);    Consulted and Agree with Plan of Care Patient               Patient will benefit from skilled therapeutic  intervention in order to improve the following deficits and impairments:  Decreased strength, Decreased range of motion, Impaired UE functional use, Pain  Visit Diagnosis: Muscle weakness (generalized)  Stiffness of right shoulder, not elsewhere classified     Problem List There are no problems to display for this patient.   PT, DPT, GCS  [facility] [address] [place], [place], [place] Phone: [phone]   Fax:  [phone]  Name: Yuko Coventry MRN: Berneta Levins Date of Birth: 06-Jul-1961

## 2021-09-09 ENCOUNTER — Ambulatory Visit: Payer: Medicare Other

## 2021-09-13 ENCOUNTER — Ambulatory Visit: Payer: Medicare Other

## 2021-09-14 ENCOUNTER — Other Ambulatory Visit: Payer: Self-pay

## 2021-09-14 ENCOUNTER — Ambulatory Visit: Payer: Medicare Other

## 2021-09-14 DIAGNOSIS — M6281 Muscle weakness (generalized): Secondary | ICD-10-CM

## 2021-09-14 DIAGNOSIS — M25611 Stiffness of right shoulder, not elsewhere classified: Secondary | ICD-10-CM

## 2021-09-14 NOTE — Therapy (Signed)
[facility] [address] [place], [place], [address] Phone: [phone]   Fax:  [phone]  Physical Therapy Treatment   Patient Details  Name: Anna Nichols MRN: [id number] Date of Birth: 1961/07/16 No data recorded  Encounter Date: 09/14/2021   PT End of Session - 09/14/21 1636     Visit Number 16    Number of Visits 17    Date for PT Re-Evaluation 09/15/21    Authorization Type eval: 07/21/21    Authorization Time Period Medicare primary; medicaid    PT Start Time 1515    PT Stop Time 1600    PT Time Calculation (min) 45 min    Activity Tolerance Patient tolerated treatment well    Behavior During Therapy WFL for tasks assessed/performed                Past Medical History:  Diagnosis Date   Diabetes mellitus without complication (HCC)    High cholesterol    Hypertension     History reviewed. No pertinent surgical history.  There were no vitals filed for this visit.   Subjective Assessment - 09/14/21 1635     Subjective Pt has been doing well since the last therapy session and denies any current R shoulder pain. She has been completing her HEP. Her shoulder continues to improve and she has no specific questions or concerns upon arrival.    Pertinent History Pt was in [place] in July 2022, she fell to the right and struck the wall with her RUE. She had immediate pain in her R shoulder. The pain slowly improved on anti-inflammatories however persisted until she eventually went to [facility]. 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 She was previously seeing physical therapy for balance, dizziness, and L sided weakness s/p CVA x 3 however would like to address her R shoulder pain and weakness at this time. She has a return visit with orthopedics on 08/04/21.    Limitations Lifting    Diagnostic  tests See history    Patient Stated Goals Improved R shoulder strength, range of motion, and pain                  TREATMENT   Ther-ex  Moist heat pack applied to R shoulder in supine during interval history x 5 minutes Supine R shoulder circles clockwise/counterclockwise at 90 flexion with 4# dumbbell (DB) x 10 each direction; Supine R shoulder serratus punch with 4# DB x 15; L sidelying R shoulder ER with 3# DB x 15; Supine R shoulder rhythmic stabilization at wrist 2 x 30s; Seated R shoulder flexion with 2# DB from neutral to 90 degrees 2 x 15; Seated R shoulder scaption 2# DB from neutral to 90 degrees 2 x 15; Incline push-ups on mat table 2 x 10; Incline alternating shoulder taps with arms supported on mat table 2 x 10;    Manual Therapy  Gentle right shoulder passive range of motion into flexion, abduction, and external rotation R shoulder GH A/P mobilizations at neutral grade I-II, 30s/bout x 2 bouts R shoulder GH inferior mobilizations at 90 abduction grade I-II, 30s/bout x 2 bouts R shoulder GH A/P mobilizations at 90 abduction and pain-free end range external rotation grade I-II, 30s/bout x 2 bouts R shoulder GH inferior mobilization at end range abduction grade I-II, 30s/bout x 2 R shoulder GH posterior and  Inferior mobilization at end range shoulder flexion grade II-III, 30s/bout x 2 bouts R shoulder GH distraction 30s/bout x 2 bouts;   Pt educated throughout session about proper posture and technique with exercises. Improved exercise technique, movement at target joints, use of target muscles after min to mod verbal, visual, tactile cues.    Continued to progress R shoulder strengthening by increasing repetitions as well as weight. Continued with closed chain strengthening. Also continued with mobilizations for pain modulation and her R shoulder ROM continues to improve. Overall pt is making excellent progress she was able to progress her repetitions today.  Will update her goals at the next session. Pt will continue to benefit from further PT services to continue to address deficits in strength, ROM and pain to return to full function at home.                      PT Short Term Goals - 08/24/21 0803       PT SHORT TERM GOAL #1   Title Pt will be independent with HEP to improve strength, range of motion, and pain in order to decrease pain and improve function at home.    Time 4    Period Weeks    Status On-going    Target Date 08/18/21               PT Long Term Goals - 08/24/21 0804       PT LONG TERM GOAL #1   Title Pt will report worst R shoulder pain as no more than 4/10 in order to demonstrate clinically significant improvement in shoulder pain.    Baseline 07/21/21: worst: 7/10; 08/24/21: 8/10 (short duration, pain subsides quicker);    Time 8    Period Weeks    Status On-going    Target Date 09/15/21      PT LONG TERM GOAL #2   Title Pt will improve R shoulder FOTO to at least 60 in order to demonstrate improvement in function related to her R shoulder    Baseline 07/21/21: 43 08/24/21: 68    Time 8    Period Weeks    Status Achieved    Target Date --      PT LONG TERM GOAL #3   Title Pt will decrease quick DASH score by at least 8% in order to demonstrate clinically significant reduction in disability.    Baseline 07/21/21: 70.5% 08/24/21: 13.6%    Time 8    Period Weeks    Status Achieved    Target Date 09/15/21      PT LONG TERM GOAL #4   Title Pt will increase pain-free strength of R shoulder flexion and abduction to at least 4+/5 in order to demonstrate improvement in strength and function when using R shoulder    Baseline 07/21/21: 3+/5 for both flexion and abduction; 08/24/21: 4/5 for both flexion and abduction    Time 8    Period Weeks    Status Revised    Target Date 09/15/21                   Plan - 09/14/21 1639     Clinical Impression Statement Continued to progress R  shoulder strengthening by increasing repetitions as well as weight. Continued with closed chain strengthening. Also continued with mobilizations for pain modulation and her R shoulder ROM continues to improve. Overall pt is making excellent progress she was able to progress her repetitions today. Will  update her goals at the next session. Pt will continue to benefit from further PT services to continue to address deficits in strength, ROM and pain to return to full function at home.    Personal Factors and Comorbidities Age;Comorbidity 3+    Comorbidities DM, HTN, hyperlipidemia, L RTC tear, CVA x 3    Examination-Activity Limitations Caring for Others;Lift;Reach Overhead;Dressing    Examination-Participation Restrictions Church;Community Activity;Shop;Laundry;Volunteer    Stability/Clinical Decision Making Evolving/Moderate complexity    Rehab Potential Fair    PT Frequency 2x / week    PT Duration 8 weeks    PT Treatment/Interventions ADLs/Self Care Home Management;Aquatic Therapy;Biofeedback;Canalith Repostioning;Cryotherapy;Electrical Stimulation;Iontophoresis 4mg /ml Dexamethasone;Moist Heat;Traction;Ultrasound;DME Instruction;Gait training;Stair training;Functional mobility training;Therapeutic activities;Therapeutic exercise;Balance training;Neuromuscular re-education;Patient/family education;Manual techniques;Passive range of motion;Dry needling;Vestibular;Joint Manipulations;Spinal Manipulations    PT Next Visit Plan Update outcome measures, goals, and recertification, Review HEP for R shoulder, continue range of motion, manual techniques, and strengthening    PT Home Exercise Plan Access Code: [email or web address] (R shoulder) Access Code: [email or web address] (balance and weakness from prior episode);    Consulted and Agree with Plan of Care Patient               Patient will benefit from skilled therapeutic intervention in order to improve the following deficits and impairments:  Decreased strength,  Decreased range of motion, Impaired UE functional use, Pain  Visit Diagnosis: Muscle weakness (generalized)  Stiffness of right shoulder, not elsewhere classified     Problem List There are no problems to display for this patient.   Lynnea Maizes PT, DPT, GCS  [facility] [address] [place], [place], [address] Phone: [phone]   Fax:  [phone]  Name: Asuzena Volcy MRN: [id number] Date of Birth: 01-Feb-1961

## 2021-09-16 ENCOUNTER — Other Ambulatory Visit: Payer: Self-pay

## 2021-09-16 ENCOUNTER — Ambulatory Visit: Payer: Medicare Other

## 2021-09-16 DIAGNOSIS — M6281 Muscle weakness (generalized): Secondary | ICD-10-CM

## 2021-09-16 DIAGNOSIS — M25611 Stiffness of right shoulder, not elsewhere classified: Secondary | ICD-10-CM

## 2021-09-16 DIAGNOSIS — G8929 Other chronic pain: Secondary | ICD-10-CM

## 2021-09-16 NOTE — Therapy (Signed)
[facility] [address] [place], [place], [address] Phone: [phone]   Fax:  [phone]  Physical Therapy Treatment/Recertification   Patient Details  Name: Anna Nichols MRN: [id number] Date of Birth: 01/20/1961 No data recorded  Encounter Date: 09/16/2021   PT End of Session - 09/16/21 1343     Visit Number 17    Number of Visits 33    Date for PT Re-Evaluation 11/11/21    Authorization Type eval: 07/21/21    Authorization Time Period Medicare primary; medicaid    PT Start Time 1400    PT Stop Time 1445    PT Time Calculation (min) 45 min    Activity Tolerance Patient tolerated treatment well    Behavior During Therapy WFL for tasks assessed/performed                 Past Medical History:  Diagnosis Date   Diabetes mellitus without complication ([place])    High cholesterol    Hypertension     History reviewed. No pertinent surgical history.  There were no vitals filed for this visit.   Subjective Assessment - 09/16/21 1340     Subjective Pt has been doing well since the last therapy session and denies any current R shoulder pain however reports soreness from last therapy session. She has been completing her HEP. Her shoulder continues to improve and she has no specific questions or concerns upon arrival.    Pertinent History Pt was in [place] in July 2022, she fell to the right and struck the wall with her RUE. She had immediate pain in her R shoulder. The pain slowly improved on anti-inflammatories however persisted until she eventually went to [facility]. 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 She was previously seeing physical therapy for balance, dizziness, and L sided weakness s/p CVA x 3 however would like to address her R shoulder pain and weakness at this time. She has a return visit  with orthopedics on 08/04/21.    Limitations Lifting    Diagnostic tests See history    Patient Stated Goals Improved R shoulder strength, range of motion, and pain    Currently in Pain? No/denies                  TREATMENT   Ther-ex  Moist heat pack applied to R shoulder in supine during interval history x 5 minutes Supine R shoulder circles clockwise/counterclockwise at 90 flexion with 4# dumbbell (DB) x 10 each direction; Supine R shoulder serratus punch with 4# DB 2 x 15; Supine R shoulder flexion with 3# DB 2 x 15; L sidelying R shoulder ER with 3# DB 2 x 15; L sidelying R shoulder abduction with 3# DB 2 x 15; Prone R shoulder extension 2 x 15; Prone R shoulder low row 2 x 15; Prone R shoulder high row 2 x 15; Prone R shoulder low trap flexion 2 x 15;   Manual Therapy  Gentle right shoulder passive range of motion into flexion, abduction, and external rotation R shoulder GH A/P mobilizations at neutral grade I-II, 30s/bout x 2 bouts R shoulder GH inferior mobilizations at 90 abduction grade I-II, 30s/bout x 2 bouts R shoulder GH A/P mobilizations at 90 abduction and pain-free end range external rotation grade I-II, 30s/bout x 2 bouts R shoulder GH inferior mobilization at end range abduction grade  I-II, 30s/bout x 2 R shoulder GH posterior and Inferior mobilization at end range shoulder flexion grade II-III, 30s/bout x 2 bouts R shoulder GH distraction 30s/bout x 2 bouts;   Pt educated throughout session about proper posture and technique with exercises. Improved exercise technique, movement at target joints, use of target muscles after min to mod verbal, visual, tactile cues.    Updated outcome measures and goals with patient during session today. Her R shoulder abduction strength has improved to 4+/5. Her FOTO scored improved to 28 which is a decline from when it was last updated but improved from the initial evaluation. Her QuickDASH dropped to 30% which is also an  improvement from 70.5% at initial evaluation but worse than when last updated. Overall pt is making excellent progress with respect to her R shoulder. Continued to progress R shoulder strengthening increasing dumbbells during session today. Pt denies any pain throughout session. Pt will continue to benefit from further PT services to continue to address deficits in strength, ROM and pain to return to full function at home.                      PT Short Term Goals - 09/16/21 1345       PT SHORT TERM GOAL #1   Title Pt will be independent with HEP to improve strength, range of motion, and pain in order to decrease pain and improve function at home.    Time 4    Period Weeks    Status On-going    Target Date 10/14/21               PT Long Term Goals - 09/16/21 1345       PT LONG TERM GOAL #1   Title Pt will report worst R shoulder pain as no more than 4/10 in order to demonstrate clinically significant improvement in shoulder pain.    Baseline 07/21/21: worst: 7/10; 08/24/21: 8/10 (short duration, pain subsides quicker);    Time 8    Period Weeks    Status Deferred    Target Date 11/11/21      PT LONG TERM GOAL #2   Title Pt will improve R shoulder FOTO to at least 60 in order to demonstrate improvement in function related to her R shoulder    Baseline 07/21/21: 43 08/24/21: 68; 09/16/21: 54    Time 8    Period Weeks    Status Partially Met    Target Date 11/11/21      PT LONG TERM GOAL #3   Title Pt will decrease quick DASH score by at least 8% in order to demonstrate clinically significant reduction in disability.    Baseline 07/21/21: 70.5% 08/24/21: 13.6%; 09/16/21: 30%    Time 8    Period Weeks    Status Achieved      PT LONG TERM GOAL #4   Title Pt will increase pain-free strength of R shoulder flexion and abduction to at least 4+/5 in order to demonstrate improvement in strength and function when using R shoulder    Baseline 07/21/21: 3+/5 for both  flexion and abduction; 08/24/21: 4/5 for both flexion and abduction; 09/16/21: 4/5 flexion, 4+/5 abduction    Time 8    Period Weeks    Status Partially Met    Target Date 11/11/21                   Plan - 09/16/21 1344     Clinical  Impression Statement Updated outcome measures and goals with patient during session today. Her R shoulder abduction strength has improved to 4+/5. Her FOTO scored improved to 9 which is a decline from when it was last updated but improved from the initial evaluation. Her QuickDASH dropped to 30% which is also an improvement from 70.5% at initial evaluation but worse than when last updated. Overall pt is making nice progress with respect to her R shoulder. Continued to progress R shoulder strengthening increasing dumbbells during session today. Pt denies any pain throughout session. Pt will continue to benefit from further PT services to continue to address deficits in strength, ROM and pain to return to full function at home.    Personal Factors and Comorbidities Age;Comorbidity 3+    Comorbidities DM, HTN, hyperlipidemia, L RTC tear, CVA x 3    Examination-Activity Limitations Caring for Others;Lift;Reach Overhead;Dressing    Examination-Participation Restrictions Church;Community Activity;Shop;Laundry;Volunteer    Stability/Clinical Decision Making Evolving/Moderate complexity    Rehab Potential Fair    PT Frequency 2x / week    PT Duration 8 weeks    PT Treatment/Interventions ADLs/Self Care Home Management;Aquatic Therapy;Biofeedback;Canalith Repostioning;Cryotherapy;Electrical Stimulation;Iontophoresis 56m/ml Dexamethasone;Moist Heat;Traction;Ultrasound;DME Instruction;Gait training;Stair training;Functional mobility training;Therapeutic activities;Therapeutic exercise;Balance training;Neuromuscular re-education;Patient/family education;Manual techniques;Passive range of motion;Dry needling;Vestibular;Joint Manipulations;Spinal Manipulations    PT Next  Visit Plan Review HEP for R shoulder, continue range of motion, manual techniques, and strengthening    PT Home Exercise Plan Access Code: [email or web address] (R shoulder) Access Code: [email or web address](balance and weakness from prior episode);    Consulted and Agree with Plan of Care Patient                Patient will benefit from skilled therapeutic intervention in order to improve the following deficits and impairments:  Decreased strength, Decreased range of motion, Impaired UE functional use, Pain  Visit Diagnosis: Muscle weakness (generalized)  Stiffness of right shoulder, not elsewhere classified  Chronic right shoulder pain     Problem List There are no problems to display for this patient.   JPhillips GroutPT, DPT, GCS  [place] [facility] [address]Phone: [phone]  Fax:  [phone] Name: Anna ZitoMRN: [id number] of Birth: 101-06-1961

## 2021-09-20 ENCOUNTER — Other Ambulatory Visit: Payer: Self-pay

## 2021-09-20 ENCOUNTER — Ambulatory Visit: Payer: Medicare Other

## 2021-09-20 DIAGNOSIS — M25611 Stiffness of right shoulder, not elsewhere classified: Secondary | ICD-10-CM

## 2021-09-20 DIAGNOSIS — M6281 Muscle weakness (generalized): Secondary | ICD-10-CM | POA: Diagnosis not present

## 2021-09-20 DIAGNOSIS — G8929 Other chronic pain: Secondary | ICD-10-CM

## 2021-09-20 NOTE — Therapy (Addendum)
[facility] [address] [place], [place], [address] Phone: [phone]   Fax:  [phone]  Physical Therapy Treatment   Patient Details  Name: Anna Nichols MRN: [id number] Date of Birth: [birth year] No data recorded  Encounter Date: 09/20/2021   PT End of Session - 09/20/21 1111     Visit Number 18    Number of Visits 33    Date for PT Re-Evaluation 11/11/21    Authorization Type eval: 07/21/21    Authorization Time Period Medicare primary; medicaid    PT Start Time 1100    PT Stop Time 1145    PT Time Calculation (min) 45 min    Activity Tolerance Patient tolerated treatment well    Behavior During Therapy WFL for tasks assessed/performed                  Past Medical History:  Diagnosis Date   Diabetes mellitus without complication ([place])    High cholesterol    Hypertension     History reviewed. No pertinent surgical history.  There were no vitals filed for this visit.   Subjective Assessment - 09/20/21 1057     Subjective Pt states no pain upon arrival, but feels slight numbness and soreness within shoulder. Pt reported minimal soreness after previous session    Pertinent History Pt was in [place] in July 2022, she fell to the right and struck the wall with her RUE. She had immediate pain in her R shoulder. The pain slowly improved on anti-inflammatories however persisted until she eventually went to [facility]. 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 She was previously seeing physical therapy for balance, dizziness, and L sided weakness s/p CVA x 3 however would like to address her R shoulder pain and weakness at this time. She has a return visit with orthopedics on 08/04/21.    Limitations Lifting    Diagnostic tests See history    Patient Stated Goals Improved R shoulder strength, range of  motion, and pain                  TREATMENT   Ther-ex  Moist heat pack applied to R shoulder in supine during interval history x 5 minutes Supine R shoulder circles CW/CCW at 90 flexion with 3# dumbbell (DB) x 10 each direction Supine R shoulder flexion dynamic stabilization 3 x 20s Supine R shoulder flexion 2# DB 2 x 10 L side-lying R shoulder ER w/2# DB 2 x 10 L side-lying R shoulder abduction w/2# DB 2 x 10 Seated green tband rows 3 x 10 Seated green tband w's 3 x 10 Standing green tband extensions 2 x 10    Manual Therapy  Gentle right shoulder passive range of motion into flexion, abduction, and external rotation R shoulder GH A/P mobilizations at neutral grade I-II, 30s/bout x 2 bouts R shoulder GH inferior mobilizations at 90 abduction grade I-II, 30s/bout x 2 bouts R shoulder GH A/P mobilizations at 90 abduction and pain-free end range external rotation grade I-II, 30s/bout x 2 bouts R shoulder GH inferior mobilization at end range abduction grade I-II, 30s/bout x 2 R shoulder GH posterior and Inferior mobilization at end range shoulder flexion grade II-III, 30s/bout x 2 bouts R shoulder GH distraction 30s/bout x 2 bouts;   Pt educated throughout session about proper posture and technique with exercises. Improved exercise  technique, movement at target joints, use of target muscles after min to mod verbal, visual, tactile cues.     Pt presented with excellent motivation during session today. Began session with [place] mobilizations to decrease pain and increase ROM. Focused session on light load strengthening in order to improve strength without increasing pain. Pt unable to utilize 4# DB for CW/CCW circles so regressed to 3# DB. Also regressed to 2# DB for shoulder flexion. Pt denied any pain or increase in sx with the banded exercises. Pt would benefit from continued PT services to address deficits in strength, ROM and pain in order to return to full function at  home.                      PT Short Term Goals - 09/16/21 1345       PT SHORT TERM GOAL #1   Title Pt will be independent with HEP to improve strength, range of motion, and pain in order to decrease pain and improve function at home.    Time 4    Period Weeks    Status On-going    Target Date 10/14/21               PT Long Term Goals - 09/16/21 1345       PT LONG TERM GOAL #1   Title Pt will report worst R shoulder pain as no more than 4/10 in order to demonstrate clinically significant improvement in shoulder pain.    Baseline 07/21/21: worst: 7/10; 08/24/21: 8/10 (short duration, pain subsides quicker);    Time 8    Period Weeks    Status Deferred    Target Date 11/11/21      PT LONG TERM GOAL #2   Title Pt will improve R shoulder FOTO to at least 60 in order to demonstrate improvement in function related to her R shoulder    Baseline 07/21/21: 43 08/24/21: 68; 09/16/21: 54    Time 8    Period Weeks    Status Partially Met    Target Date 11/11/21      PT LONG TERM GOAL #3   Title Pt will decrease quick DASH score by at least 8% in order to demonstrate clinically significant reduction in disability.    Baseline 07/21/21: 70.5% 08/24/21: 13.6%; 09/16/21: 30%    Time 8    Period Weeks    Status Achieved      PT LONG TERM GOAL #4   Title Pt will increase pain-free strength of R shoulder flexion and abduction to at least 4+/5 in order to demonstrate improvement in strength and function when using R shoulder    Baseline 07/21/21: 3+/5 for both flexion and abduction; 08/24/21: 4/5 for both flexion and abduction; 09/16/21: 4/5 flexion, 4+/5 abduction    Time 8    Period Weeks    Status Partially Met    Target Date 11/11/21                   Plan - 09/20/21 1116     Clinical Impression Statement Pt presented with excellent motivation during session today. Began session with [place] mobilizations to decrease pain and increase ROM. Focused session on  light load strengthening in order to improve strength without increasing pain. Pt unable to utilize 4# DB for CW/CCW circles so regressed to 3# DB. Also regressed to 2# DB for shoulder flexion. Pt denied any pain or increase in sx with the banded exercises. Pt  would benefit from continued PT services to address deficits in strength, ROM and pain in order to return to full function at home.    Personal Factors and Comorbidities Age;Comorbidity 3+    Comorbidities DM, HTN, hyperlipidemia, L RTC tear, CVA x 3    Examination-Activity Limitations Caring for Others;Lift;Reach Overhead;Dressing    Examination-Participation Restrictions Church;Community Activity;Shop;Laundry;Volunteer    Stability/Clinical Decision Making Evolving/Moderate complexity    Rehab Potential Fair    PT Frequency 2x / week    PT Duration 8 weeks    PT Treatment/Interventions ADLs/Self Care Home Management;Aquatic Therapy;Biofeedback;Canalith Repostioning;Cryotherapy;Electrical Stimulation;Iontophoresis 64m/ml Dexamethasone;Moist Heat;Traction;Ultrasound;DME Instruction;Gait training;Stair training;Functional mobility training;Therapeutic activities;Therapeutic exercise;Balance training;Neuromuscular re-education;Patient/family education;Manual techniques;Passive range of motion;Dry needling;Vestibular;Joint Manipulations;Spinal Manipulations    PT Next Visit Plan Review HEP for R shoulder, continue range of motion, manual techniques, and strengthening    PT Home Exercise Plan Access Code: [email or web address] (R shoulder) Access Code: [email or web address](balance and weakness from prior episode);    Consulted and Agree with Plan of Care Patient                 Patient will benefit from skilled therapeutic intervention in order to improve the following deficits and impairments:  Decreased strength, Decreased range of motion, Impaired UE functional use, Pain  Visit Diagnosis: Muscle weakness (generalized)  Stiffness of right shoulder, not  elsewhere classified  Chronic right shoulder pain     Problem List There are no problems to display for this patient.  AGinnie Smart SPT JPhillips GroutPT, DPT, GCS  [place] [facility] BeachMS[facility] Hospital13[address] Ship[address] AvenueMRathdrum N[place] [address]Phone: [phone]  Fax:  [phone] Name: Anna CoonradtMRN: [id number] of Birth: 117-Mar-1962

## 2021-09-24 ENCOUNTER — Ambulatory Visit: Payer: Medicare Other

## 2021-09-24 ENCOUNTER — Other Ambulatory Visit: Payer: Self-pay

## 2021-09-24 DIAGNOSIS — M6281 Muscle weakness (generalized): Secondary | ICD-10-CM

## 2021-09-24 DIAGNOSIS — M25611 Stiffness of right shoulder, not elsewhere classified: Secondary | ICD-10-CM

## 2021-09-24 NOTE — Therapy (Addendum)
[facility] [address] [place], [place], [address] Phone: [phone]   Fax:  [phone]  Physical Therapy Treatment   Patient Details  Name: Anna Nichols MRN: [id number] Date of Birth: [birth year] No data recorded  Encounter Date: 09/24/2021   PT End of Session - 09/24/21 0754     Visit Number 19    Number of Visits 33    Date for PT Re-Evaluation 11/11/21    Authorization Type eval: 07/21/21    Authorization Time Period Medicare primary; medicaid    PT Start Time 0800    PT Stop Time 0843    PT Time Calculation (min) 43 min    Activity Tolerance Patient tolerated treatment well    Behavior During Therapy [facility] for tasks assessed/performed                  Past Medical History:  Diagnosis Date   Diabetes mellitus without complication ([place])    High cholesterol    Hypertension     History reviewed. No pertinent surgical history.  There were no vitals filed for this visit.   Subjective Assessment - 09/24/21 0801     Subjective Pt states no pain upon arrival and reports that the pain has been a lot better recently. Pt states that the numbness comes and goes, but never gets too bad. Pt reports no soreness after previous session.    Pertinent History Pt was in [place] in July 2022, she fell to the right and struck the wall with her RUE. She had immediate pain in her R shoulder. The pain slowly improved on anti-inflammatories however persisted until she eventually went to [facility]. 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 She was previously seeing physical therapy for balance, dizziness, and L sided weakness s/p CVA x 3 however would like to address her R shoulder pain and weakness at this time. She has a return visit with orthopedics on 08/04/21.    Limitations Lifting    Diagnostic tests See history     Patient Stated Goals Improved R shoulder strength, range of motion, and pain                   TREATMENT   Ther-ex  Moist heat pack applied to R shoulder in supine during interval history x 5 minutes Supine R shoulder flexion 2# DB 2 x 10 Supine R shoulder circles CW/CCW at 90 flexion with 2# dumbbell (DB) 2 x 10 each direction Supine R shoulder flexion dynamic stabilization 3 x 20s L side-lying R shoulder ER w/2# DB 2 x 10 L side-lying R shoulder abduction w/2# DB x 2; painful so regressed to 1# DB 2 x 10 Ball circles against the wall x 10 CW, x6 CCW; painful so stopped Green tband seated rows 3 x 10    Manual Therapy  Gentle right shoulder passive range of motion into flexion, abduction, and external rotation R shoulder GH A/P mobilizations at neutral grade I-II, 30s/bout x 3 bouts R shoulder GH A/P mobilizations at 90 abduction and pain-free end range external rotation grade I-II, 30s/bout x 3 bouts R shoulder GH inferior mobilizations at 90 abduction grade I-II, 30s/bout x 3 bouts R shoulder GH inferior mobilization at end range abduction grade I-II, 30s/bout x 3   Pt educated throughout session about proper posture and technique with exercises. Improved exercise technique,  movement at target joints, use of target muscles after min to mod verbal, visual, tactile cues.     Pt present with great motivation during session today. Pt's pain is becoming less frequent at rest, but still occurs often with movement. Began session with [place] mobs to decrease any current pain and increase ROM. Continued to focus on light load strengthening. Pt reported a tightness and pain with L-sidelying R shoulder abduction with the 2# Db so regressed to the 1# DB which decreased the pt's pain. Pt reported a pinching sensation with the CW/CCW ball circles on the door so seized that exercise and instead did some periscapular strengthening with the green tband. Pt would benefit from continued PT  services in order to address deficits in strength, ROM, and pain in order to return to pain free function at home.   PT Short Term Goals - 09/16/21 1345       PT SHORT TERM GOAL #1   Title Pt will be independent with HEP to improve strength, range of motion, and pain in order to decrease pain and improve function at home.    Time 4    Period Weeks    Status On-going    Target Date 10/14/21               PT Long Term Goals - 09/16/21 1345       PT LONG TERM GOAL #1   Title Pt will report worst R shoulder pain as no more than 4/10 in order to demonstrate clinically significant improvement in shoulder pain.    Baseline 07/21/21: worst: 7/10; 08/24/21: 8/10 (short duration, pain subsides quicker);    Time 8    Period Weeks    Status Deferred    Target Date 11/11/21      PT LONG TERM GOAL #2   Title Pt will improve R shoulder FOTO to at least 60 in order to demonstrate improvement in function related to her R shoulder    Baseline 07/21/21: 43 08/24/21: 68; 09/16/21: 54    Time 8    Period Weeks    Status Partially Met    Target Date 11/11/21      PT LONG TERM GOAL #3   Title Pt will decrease quick DASH score by at least 8% in order to demonstrate clinically significant reduction in disability.    Baseline 07/21/21: 70.5% 08/24/21: 13.6%; 09/16/21: 30%    Time 8    Period Weeks    Status Achieved      PT LONG TERM GOAL #4   Title Pt will increase pain-free strength of R shoulder flexion and abduction to at least 4+/5 in order to demonstrate improvement in strength and function when using R shoulder    Baseline 07/21/21: 3+/5 for both flexion and abduction; 08/24/21: 4/5 for both flexion and abduction; 09/16/21: 4/5 flexion, 4+/5 abduction    Time 8    Period Weeks    Status Partially Met    Target Date 11/11/21                   Plan - 09/24/21 0909     Clinical Impression Statement Pt present with great motivation during session today. Pt's pain is becoming less  frequent at rest, but still occurs often with movement. Began session with [place] mobs to decrease any current pain and increase ROM. Continued to focus on light load strengthening. Pt reported a tightness and pain with L-sidelying R shoulder abduction with the 2# Db  so regressed to the 1# DB which decreased the pt's pain. Pt reported a pinching sensation with the CW/CCW ball circles on the door so seized that exercise and instead did some periscapular strengthening with the green tband. Pt would benefit from continued PT services in order to address deficits in strength, ROM, and pain in order to return to pain free function at home.    Personal Factors and Comorbidities Age;Comorbidity 3+    Comorbidities DM, HTN, hyperlipidemia, L RTC tear, CVA x 3    Examination-Activity Limitations Caring for Others;Lift;Reach Overhead;Dressing    Examination-Participation Restrictions Church;Community Activity;Shop;Laundry;Volunteer    Stability/Clinical Decision Making Evolving/Moderate complexity    Rehab Potential Fair    PT Frequency 2x / week    PT Duration 8 weeks    PT Treatment/Interventions ADLs/Self Care Home Management;Aquatic Therapy;Biofeedback;Canalith Repostioning;Cryotherapy;Electrical Stimulation;Iontophoresis 70m/ml Dexamethasone;Moist Heat;Traction;Ultrasound;DME Instruction;Gait training;Stair training;Functional mobility training;Therapeutic activities;Therapeutic exercise;Balance training;Neuromuscular re-education;Patient/family education;Manual techniques;Passive range of motion;Dry needling;Vestibular;Joint Manipulations;Spinal Manipulations    PT Next Visit Plan Review HEP for R shoulder, continue range of motion, manual techniques, and strengthening    PT Home Exercise Plan Access Code: [email or web address] (R shoulder) Access Code: [email or web address](balance and weakness from prior episode);    Consulted and Agree with Plan of Care Patient                  Patient will benefit from skilled  therapeutic intervention in order to improve the following deficits and impairments:  Decreased strength, Decreased range of motion, Impaired UE functional use, Pain  Visit Diagnosis: Stiffness of right shoulder, not elsewhere classified  Muscle weakness (generalized)     Problem List There are no problems to display for this patient.  AGinnie Smart SPT CRochesterFairly IV, PT, DPT Physical Therapist- [place]  [facility] [place] [address]Phone: [phone]  Fax:  [phone] Name: MKhira CudmoreMRN: [id number] of Birth: 1November 07, 1962

## 2021-09-27 ENCOUNTER — Ambulatory Visit: Payer: Medicare Other

## 2021-09-27 ENCOUNTER — Other Ambulatory Visit: Payer: Self-pay

## 2021-09-27 DIAGNOSIS — M25611 Stiffness of right shoulder, not elsewhere classified: Secondary | ICD-10-CM

## 2021-09-27 DIAGNOSIS — G8929 Other chronic pain: Secondary | ICD-10-CM

## 2021-09-27 DIAGNOSIS — M6281 Muscle weakness (generalized): Secondary | ICD-10-CM | POA: Diagnosis not present

## 2021-09-27 NOTE — Therapy (Addendum)
Anna Nichols [address] [place], [place], [address] Phone: [phone]   Fax:  [phone]  Physical Therapy Treatment/Progress Note  Dates of reporting period  08/24/21   to   09/28/21   Patient Details  Name: Anna Nichols MRN: [id number] Date of Birth: 01-05-1961 No data recorded  Encounter Date: 09/27/2021   PT End of Session - 09/27/21 1109     Visit Number 20    Number of Visits 33    Date for PT Re-Evaluation 11/11/21    Authorization Type eval: 07/21/21    Authorization Time Period Medicare primary; medicaid    PT Start Time 1100    PT Stop Time 1145    PT Time Calculation (min) 45 min    Activity Tolerance Patient tolerated treatment well    Behavior During Therapy WFL for tasks assessed/performed                  Past Medical History:  Diagnosis Date   Diabetes mellitus without complication ([place])    High cholesterol    Hypertension     History reviewed. No pertinent surgical history.  There were no vitals filed for this visit.   Subjective Assessment - 09/27/21 1127     Subjective Pt states that she is feeling good upon arrival. Pt reports that she believes her shoulder is 60-70% better than when she started. Pt denied being in any pain upon arrival.    Pertinent History Pt was in [place] in July 2022, she fell to the right and struck the wall with her RUE. She had immediate pain in her R shoulder. The pain slowly improved on anti-inflammatories however persisted until she eventually went to [facility]. 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 She was previously seeing physical therapy for balance, dizziness, and L sided weakness s/p CVA x 3 however would like to address her R shoulder pain and weakness at this time. She has a return visit with orthopedics on 08/04/21.    Limitations  Lifting    Diagnostic tests See history    Patient Stated Goals Improved R shoulder strength, range of motion, and pain                    TREATMENT   Ther-ex  Moist heat pack applied to R shoulder in supine during interval history x 5 minutes Seated R shoulder flexion 2# DB 3 x 10 Seated R shoulder abduction 2# DB 3 x 10 Seated green tband row 2 x 10 Seated green tband w's 2 x 10 Standing green tband extensions 2 x 10 Green tband ER walk outs x 10      Manual Therapy  Gentle right shoulder passive range of motion into flexion, abduction, and external rotation R shoulder GH A/P mobilizations at neutral grade I-II, 30s/bout x 3 bouts R shoulder GH A/P mobilizations at 90 abduction and pain-free end range external rotation grade I-II, 30s/bout x 3 bouts R shoulder GH inferior mobilizations at 90 abduction grade I-II, 30s/bout x 3 bouts R shoulder GH inferior mobilization at end range abduction grade I-II, 30s/bout x 3   Pt educated throughout session about proper posture and technique with exercises. Improved exercise technique, movement at target joints, use of target muscles after min to mod verbal, visual, tactile cues.    Pt presented with excellent motivation during session  today. Focused session on light load strengthening and progressing her exercises. Pt reported less pain and soreness upon arrival today so was able to progress from supine based exercises to seated. Pt reported general muscle fatigue after exercises, but denied any pain. Pt required cueing to maintain proper positioning for green tband w's and ER walk outs. Pt would benefit from continued PT services in order to address deficits in strength, ROM and pain in order to return to pain free function at home.     PT Short Term Goals - 09/16/21 1345       PT SHORT TERM GOAL #1   Title Pt will be independent with HEP to improve strength, range of motion, and pain in order to decrease pain and improve  function at home.    Time 4    Period Weeks    Status On-going    Target Date 10/14/21               PT Long Term Goals - 09/16/21 1345       PT LONG TERM GOAL #1   Title Pt will report worst R shoulder pain as no more than 4/10 in order to demonstrate clinically significant improvement in shoulder pain.    Baseline 07/21/21: worst: 7/10; 08/24/21: 8/10 (short duration, pain subsides quicker);    Time 8    Period Weeks    Status Deferred    Target Date 11/11/21      PT LONG TERM GOAL #2   Title Pt will improve R shoulder FOTO to at least 60 in order to demonstrate improvement in function related to her R shoulder    Baseline 07/21/21: 43 08/24/21: 68; 09/16/21: 54    Time 8    Period Weeks    Status Partially Met    Target Date 11/11/21      PT LONG TERM GOAL #3   Title Pt will decrease quick DASH score by at least 8% in order to demonstrate clinically significant reduction in disability.    Baseline 07/21/21: 70.5% 08/24/21: 13.6%; 09/16/21: 30%    Time 8    Period Weeks    Status Achieved      PT LONG TERM GOAL #4   Title Pt will increase pain-free strength of R shoulder flexion and abduction to at least 4+/5 in order to demonstrate improvement in strength and function when using R shoulder    Baseline 07/21/21: 3+/5 for both flexion and abduction; 08/24/21: 4/5 for both flexion and abduction; 09/16/21: 4/5 flexion, 4+/5 abduction    Time 8    Period Weeks    Status Partially Met    Target Date 11/11/21                   Plan - 09/27/21 1205     Clinical Impression Statement Pt presented with excellent motivation during session today. Focused session on light load strengthening and progressing her exercises. Pt reported less pain and soreness upon arrival today so was able to progress from supine based exercises to seated. Pt reported general muscle fatigue after exercises, but denied any pain. Pt required cueing to maintain proper positioning for green tband  w's and ER walk outs. Pt would benefit from continued PT services in order to address deficits in strength, ROM and pain in order to return to pain free function at home.    Personal Factors and Comorbidities Age;Comorbidity 3+    Comorbidities DM, HTN, hyperlipidemia, L RTC tear, CVA x 3  Examination-Activity Limitations Caring for Others;Lift;Reach Overhead;Dressing    Examination-Participation Restrictions Church;Community Activity;Shop;Laundry;Volunteer    Stability/Clinical Decision Making Evolving/Moderate complexity    Rehab Potential Fair    PT Frequency 2x / week    PT Duration 8 weeks    PT Treatment/Interventions ADLs/Self Care Home Management;Aquatic Therapy;Biofeedback;Canalith Repostioning;Cryotherapy;Electrical Stimulation;Iontophoresis 70m/ml Dexamethasone;Moist Heat;Traction;Ultrasound;DME Instruction;Gait training;Stair training;Functional mobility training;Therapeutic activities;Therapeutic exercise;Balance training;Neuromuscular re-education;Patient/family education;Manual techniques;Passive range of motion;Dry needling;Vestibular;Joint Manipulations;Spinal Manipulations    PT Next Visit Plan Review HEP for R shoulder, continue range of motion, manual techniques, and strengthening    PT Home Exercise Plan Access Code: [email or web address] (R shoulder) Access Code: [email or web address](balance and weakness from prior episode);    Consulted and Agree with Plan of Care Patient                   Patient will benefit from skilled therapeutic intervention in order to improve the following deficits and impairments:  Decreased strength, Decreased range of motion, Impaired UE functional use, Pain  Visit Diagnosis: Muscle weakness (generalized)  Stiffness of right shoulder, not elsewhere classified  Chronic right shoulder pain     Problem List There are no problems to display for this patient.   Anna GroutPT, DPT, Anna Nichols  Anna Nichols LLC[address] AvenueMNaschitti [place] [address]Phone: [phone]  Fax:  [phone] Name: MSummer MccolganMRN: [id number] of Birth: 109-14-1962

## 2021-10-01 ENCOUNTER — Ambulatory Visit: Payer: Medicare Other | Attending: Neurology

## 2021-10-01 ENCOUNTER — Other Ambulatory Visit: Payer: Self-pay

## 2021-10-01 DIAGNOSIS — M25611 Stiffness of right shoulder, not elsewhere classified: Secondary | ICD-10-CM | POA: Insufficient documentation

## 2021-10-01 DIAGNOSIS — M25511 Pain in right shoulder: Secondary | ICD-10-CM | POA: Diagnosis present

## 2021-10-01 DIAGNOSIS — M6281 Muscle weakness (generalized): Secondary | ICD-10-CM | POA: Diagnosis present

## 2021-10-01 DIAGNOSIS — G8929 Other chronic pain: Secondary | ICD-10-CM | POA: Diagnosis present

## 2021-10-01 NOTE — Therapy (Addendum)
[place] ?[facility] ?[address] [place], [place], [address] ?Phone: [phone]   Fax:  [phone] ? ?Physical Therapy Treatment ? ? ?Patient Details  ?Name: Anna Nichols ?MRN: [id number] ?Date of Birth: [birth year] ?No data recorded ? ?Encounter Date: 10/01/2021 ? ? PT End of Session - 10/01/21 0856   ? ? Visit Number 21   ? Number of Visits 33   ? Date for PT Re-Evaluation 11/11/21   ? Authorization Type eval: 07/21/21   ? Authorization Time Period Medicare primary; medicaid   ? PT Start Time [phone]   ? PT Stop Time 0934   ? PT Time Calculation (min) 44 min   ? Activity Tolerance Patient tolerated treatment well   ? Behavior During Therapy [facility] for tasks assessed/performed   ? ?  ?  ? ?  ? ? ? ? ? ? ? ?Past Medical History:  ?Diagnosis Date  ? Diabetes mellitus without complication ([place])   ? High cholesterol   ? Hypertension   ? ? ?History reviewed. No pertinent surgical history. ? ?There were no vitals filed for this visit. ? ? Subjective Assessment - 10/01/21 0852   ? ? Subjective Pt states that she is a little sore this morning due to the weather. Pt reports that she felt good after last session just felt a little tired.   ? Pertinent History Pt was in [place] in July 2022, she fell to the right and struck the wall with her RUE. She had immediate pain in her R shoulder. The pain slowly improved on anti-inflammatories however persisted until she eventually went to [facility]. 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 She was previously seeing physical therapy for balance, dizziness, and L sided weakness s/p CVA x 3 however would like to address her R shoulder pain and weakness at this time. She has a return visit with orthopedics on 08/04/21.   ? Limitations Lifting   ? Diagnostic tests See history   ? Patient Stated Goals Improved R shoulder strength, range  of motion, and pain   ? ?  ?  ? ?  ? ? ? ? ? ? ? ? ? ?TREATMENT ? ? ?Ther-ex  ?Moist heat pack applied to R shoulder in supine during interval history x 5 minutes ?Seated R shoulder flexion 2# DB 2 x 10 ?Seated R shoulder abduction 2# DB 2 x 10 ?Seated dynamic stabilization 3 x 20s ?Seated green tband row 2 x 10 ?Seated green tband w's 2 x 10 ?Standing anchored green tband ER and IR x 10 each ? ? ?Manual Therapy  ?Gentle right shoulder passive range of motion into flexion, abduction, and external rotation ?R shoulder GH A/P mobilizations at neutral grade I-II, 30s/bout x 3 bouts ?R shoulder GH A/P mobilizations at 90 abduction and pain-free end range external rotation grade I-II, 30s/bout x 3 bouts ?R shoulder GH inferior mobilizations at 90 abduction grade I-II, 30s/bout x 3 bouts ?R shoulder GH inferior mobilization at end range abduction grade I-II, 30s/bout x 3 ? ? ?Pt educated throughout session about proper posture and technique with exercises. Improved exercise technique, movement at target joints, use of target muscles after min to mod verbal, visual, tactile cues.  ? ? ?Focused session on progression of light load strengthening by continuing with seated and standing exercises instead of supine. Pt reported no fatigue or pain with  seated exercises today. Pt stated that standing ER and IR with anchored green tband caused some muscle fatigue and she exhibited minor muscle trembling within the last 2 reps of each. Pt would benefit from continued PT services in order to address deficits in strength, ROM and pain in order to return to pain free function at home. ? ? ? ? ? PT Short Term Goals - 09/16/21 1345   ? ?  ? PT SHORT TERM GOAL #1  ? Title Pt will be independent with HEP to improve strength, range of motion, and pain in order to decrease pain and improve function at home.   ? Time 4   ? Period Weeks   ? Status On-going   ? Target Date 10/14/21   ? ?  ?  ? ?  ? ? ? ? PT Long Term Goals - 09/16/21 1345   ? ?   ? PT LONG TERM GOAL #1  ? Title Pt will report worst R shoulder pain as no more than 4/10 in order to demonstrate clinically significant improvement in shoulder pain.   ? Baseline 07/21/21: worst: 7/10; 08/24/21: 8/10 (short duration, pain subsides quicker);   ? Time 8   ? Period Weeks   ? Status Deferred   ? Target Date 11/11/21   ?  ? PT LONG TERM GOAL #2  ? Title Pt will improve R shoulder FOTO to at least 60 in order to demonstrate improvement in function related to her R shoulder   ? Baseline 07/21/21: 43 08/24/21: 68; 09/16/21: 54   ? Time 8   ? Period Weeks   ? Status Partially Met   ? Target Date 11/11/21   ?  ? PT LONG TERM GOAL #3  ? Title Pt will decrease quick DASH score by at least 8% in order to demonstrate clinically significant reduction in disability.   ? Baseline 07/21/21: 70.5% 08/24/21: 13.6%; 09/16/21: 30%   ? Time 8   ? Period Weeks   ? Status Achieved   ?  ? PT LONG TERM GOAL #4  ? Title Pt will increase pain-free strength of R shoulder flexion and abduction to at least 4+/5 in order to demonstrate improvement in strength and function when using R shoulder   ? Baseline 07/21/21: 3+/5 for both flexion and abduction; 08/24/21: 4/5 for both flexion and abduction; 09/16/21: 4/5 flexion, 4+/5 abduction   ? Time 8   ? Period Weeks   ? Status Partially Met   ? Target Date 11/11/21   ? ?  ?  ? ?  ? ? ? ? ? ? ? ? Plan - 10/01/21 0940   ? ? Clinical Impression Statement Focused session on progression of light load strengthening by continuing with seated and standing exercises instead of supine. Pt reported no fatigue or pain with seated exercises today. Pt stated that standing ER and IR with anchored green tband caused some muscle fatigue and she exhibited minor muscle trembling within the last 2 reps of each. Pt would benefit from continued PT services in order to address deficits in strength, ROM and pain in order to return to pain free function at home.   ? Personal Factors and Comorbidities  Age;Comorbidity 3+   ? Comorbidities DM, HTN, hyperlipidemia, L RTC tear, CVA x 3   ? Examination-Activity Limitations Caring for Others;Lift;Reach Overhead;Dressing   ? Examination-Participation Restrictions Church;Community Activity;Shop;Laundry;Volunteer   ? Stability/Clinical Decision Making Evolving/Moderate complexity   ? Rehab Potential Fair   ?  PT Frequency 2x / week   ? PT Duration 8 weeks   ? PT Treatment/Interventions ADLs/Self Care Home Management;Aquatic Therapy;Biofeedback;Canalith Repostioning;Cryotherapy;Electrical Stimulation;Iontophoresis 38m/ml Dexamethasone;Moist Heat;Traction;Ultrasound;DME Instruction;Gait training;Stair training;Functional mobility training;Therapeutic activities;Therapeutic exercise;Balance training;Neuromuscular re-education;Patient/family education;Manual techniques;Passive range of motion;Dry needling;Vestibular;Joint Manipulations;Spinal Manipulations   ? PT Next Visit Plan Review HEP for R shoulder, continue range of motion, manual techniques, and strengthening   ? PT Home Exercise Plan Access Code: [email or web address](R shoulder) Access Code: [email or web address](balance and weakness from prior episode);   ? Consulted and Agree with Plan of Care Patient   ? ?  ?  ? ?  ? ? ? ? ? ? ? ? ? ?Patient will benefit from skilled therapeutic intervention in order to improve the following deficits and impairments:  Decreased strength, Decreased range of motion, Impaired UE functional use, Pain ? ?Visit Diagnosis: ?Stiffness of right shoulder, not elsewhere classified ? ?Chronic right shoulder pain ? ?Muscle weakness (generalized) ? ? ? ? ?Problem List ?There are no problems to display for this patient. ? ? ?JLyndel SafeHuprich PT, DPT, GCS  ?Ayane Delancey, SPT ?[place] ?Physical Therapist- [place] ?[facility] ?[address] ?[place] [address]?Phone: [phone]  Fax:  [phone]? ?Name: MAshna Dorough?MRN:  [id number]?Date of Birth: 101-10-1960? ? ? ?

## 2021-10-05 ENCOUNTER — Other Ambulatory Visit: Payer: Self-pay

## 2021-10-05 ENCOUNTER — Ambulatory Visit: Payer: Medicare Other

## 2021-10-05 DIAGNOSIS — G8929 Other chronic pain: Secondary | ICD-10-CM

## 2021-10-05 DIAGNOSIS — M25611 Stiffness of right shoulder, not elsewhere classified: Secondary | ICD-10-CM

## 2021-10-05 NOTE — Therapy (Signed)
[place] [facility] [address] [place], [place], [address] Phone: [phone]   Fax:  [phone]  Physical Therapy Treatment   Patient Details  Name: Anna Nichols MRN: [id number] Date of Birth: 1960-09-12 No data recorded  Encounter Date: 10/05/2021   PT End of Session - 10/05/21 1429     Visit Number 22    Number of Visits 33    Date for PT Re-Evaluation 11/11/21    Authorization Type eval: 07/21/21    Authorization Time Period Medicare primary; medicaid    PT Start Time 1405    PT Stop Time 1445    PT Time Calculation (min) 40 min    Activity Tolerance Patient tolerated treatment well    Behavior During Therapy WFL for tasks assessed/performed                   Past Medical History:  Diagnosis Date   Diabetes mellitus without complication ([place])    High cholesterol    Hypertension     History reviewed. No pertinent surgical history.  There were no vitals filed for this visit.   Subjective Assessment - 10/05/21 1423     Subjective Pt states that she is doing well today. No resting pain upon arrival.  She saw the orthopedic surgeon and because of her progress he said she no longer needs to consider surgery for her L shoulder. No specific concerns upon arrival.    Pertinent History Pt was in [place] in July 2022, she fell to the right and struck the wall with her RUE. She had immediate pain in her R shoulder. The pain slowly improved on anti-inflammatories however persisted until she eventually went to [facility]. 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 She was previously seeing physical therapy for balance, dizziness, and L sided weakness s/p CVA x 3 however would like to address her R shoulder pain and weakness at this time. She has a return visit with orthopedics on 08/04/21.    Limitations Lifting     Diagnostic tests See history    Patient Stated Goals Improved R shoulder strength, range of motion, and pain                    TREATMENT   Ther-ex  Moist heat pack applied to R shoulder in supine during interval history x 5 minutes Supine R shoulder flexion 3# DB 2 x 10 Supine R shoulder circles with 3# DB 2 x 10; Supine R shoulder serratus punch with 3# DB 2 x 10; Supine R elbow flexion with 3# DB 2 x 10; Supine R elbow manually resisted extension 2 x 10; L sidelying R shoulder abduction with 3# DB 2 x 10; L sidelying R shoulder ER with 3# DB 2 x 10; Prone L shoulder low rows with 3# DB x 10; Prone L shoulder high rows with 3# DB x 10; Prone L shoulder low trap scaption (Y's) un weighted x 10, light assist required by therapist to prevent pain;   Manual Therapy  Gentle right shoulder passive range of motion into flexion, abduction, and external rotation R shoulder GH A/P mobilizations at neutral grade I-II, 30s/bout x 3 bouts R shoulder GH A/P mobilizations at 90 abduction and pain-free end range external rotation grade I-II, 30s/bout x 3 bouts R shoulder GH inferior mobilizations at 90 abduction grade I-II, 30s/bout  x 3 bouts R shoulder GH inferior mobilization at end range abduction grade I-II, 30s/bout x 3   Pt educated throughout session about proper posture and technique with exercises. Improved exercise technique, movement at target joints, use of target muscles after min to mod verbal, visual, tactile cues.    Focused session on progression of light load strengthening in supine, sidelying, and prone positions. Increased weight is challenging for pt however she is able to complete all exercises as instructed. Mild pain reported during prone "Y's" but abolished with light assist from therapist. Pt would benefit from continued PT services in order to address deficits in strength, ROM and pain in order to return to pain free function at home.      PT Short  Term Goals - 09/16/21 1345       PT SHORT TERM GOAL #1   Title Pt will be independent with HEP to improve strength, range of motion, and pain in order to decrease pain and improve function at home.    Time 4    Period Weeks    Status On-going    Target Date 10/14/21               PT Long Term Goals - 09/16/21 1345       PT LONG TERM GOAL #1   Title Pt will report worst R shoulder pain as no more than 4/10 in order to demonstrate clinically significant improvement in shoulder pain.    Baseline 07/21/21: worst: 7/10; 08/24/21: 8/10 (short duration, pain subsides quicker);    Time 8    Period Weeks    Status Deferred    Target Date 11/11/21      PT LONG TERM GOAL #2   Title Pt will improve R shoulder FOTO to at least 60 in order to demonstrate improvement in function related to her R shoulder    Baseline 07/21/21: 43 08/24/21: 68; 09/16/21: 54    Time 8    Period Weeks    Status Partially Met    Target Date 11/11/21      PT LONG TERM GOAL #3   Title Pt will decrease quick DASH score by at least 8% in order to demonstrate clinically significant reduction in disability.    Baseline 07/21/21: 70.5% 08/24/21: 13.6%; 09/16/21: 30%    Time 8    Period Weeks    Status Achieved      PT LONG TERM GOAL #4   Title Pt will increase pain-free strength of R shoulder flexion and abduction to at least 4+/5 in order to demonstrate improvement in strength and function when using R shoulder    Baseline 07/21/21: 3+/5 for both flexion and abduction; 08/24/21: 4/5 for both flexion and abduction; 09/16/21: 4/5 flexion, 4+/5 abduction    Time 8    Period Weeks    Status Partially Met    Target Date 11/11/21                   Plan - 10/05/21 1609     Clinical Impression Statement Focused session on progression of light load strengthening in supine, sidelying, and prone positions. Increased weight is challenging for pt however she is able to complete all exercises as instructed. Mild  pain reported during prone "Y's" but abolished with light assist from therapist. Pt would benefit from continued PT services in order to address deficits in strength, ROM and pain in order to return to pain free function at home.    Personal  Factors and Comorbidities Age;Comorbidity 3+    Comorbidities DM, HTN, hyperlipidemia, L RTC tear, CVA x 3    Examination-Activity Limitations Caring for Others;Lift;Reach Overhead;Dressing    Examination-Participation Restrictions Church;Community Activity;Shop;Laundry;Volunteer    Stability/Clinical Decision Making Evolving/Moderate complexity    Rehab Potential Fair    PT Frequency 2x / week    PT Duration 8 weeks    PT Treatment/Interventions ADLs/Self Care Home Management;Aquatic Therapy;Biofeedback;Canalith Repostioning;Cryotherapy;Electrical Stimulation;Iontophoresis 23m/ml Dexamethasone;Moist Heat;Traction;Ultrasound;DME Instruction;Gait training;Stair training;Functional mobility training;Therapeutic activities;Therapeutic exercise;Balance training;Neuromuscular re-education;Patient/family education;Manual techniques;Passive range of motion;Dry needling;Vestibular;Joint Manipulations;Spinal Manipulations    PT Next Visit Plan Review HEP for R shoulder, continue range of motion, manual techniques, and strengthening    PT Home Exercise Plan Access Code: [email or web address] (R shoulder) Access Code: [email or web address](balance and weakness from prior episode);    Consulted and Agree with Plan of Care Patient                     Patient will benefit from skilled therapeutic intervention in order to improve the following deficits and impairments:  Decreased strength, Decreased range of motion, Impaired UE functional use, Pain  Visit Diagnosis: Stiffness of right shoulder, not elsewhere classified  Chronic right shoulder pain     Problem List There are no problems to display for this patient.   JPhillips GroutPT, DPT, GCS  [place] Physical  Therapist- [facility]ingtonM[facility] LLC[address]MPlankinton [place] [address]Phone: [phone]  Fax:  [phone] Name: MElease SwarmMRN: [id number] of Birth: [birth year]

## 2021-10-08 ENCOUNTER — Ambulatory Visit: Payer: Medicare Other

## 2021-10-08 ENCOUNTER — Other Ambulatory Visit: Payer: Self-pay

## 2021-10-08 DIAGNOSIS — G8929 Other chronic pain: Secondary | ICD-10-CM

## 2021-10-08 DIAGNOSIS — M25611 Stiffness of right shoulder, not elsewhere classified: Secondary | ICD-10-CM | POA: Diagnosis not present

## 2021-10-08 DIAGNOSIS — M25511 Pain in right shoulder: Secondary | ICD-10-CM

## 2021-10-08 NOTE — Therapy (Signed)
[place] ?[facility] ?[address] [place], [place], [address] ?Phone: [phone]   Fax:  [phone] ? ?Physical Therapy Treatment ? ? ?Patient Details  ?Name: Anna Nichols ?MRN: [id number] ?Date of Birth: 1961-01-06 ?No data recorded ? ?Encounter Date: 10/08/2021 ? ? PT End of Session - 10/08/21 0826   ? ? Visit Number 23   ? Number of Visits 33   ? Date for PT Re-Evaluation 11/11/21   ? Authorization Type eval: 07/21/21   ? Authorization Time Period Medicare primary; medicaid   ? PT Start Time 0830   ? PT Stop Time 7867   ? PT Time Calculation (min) 45 min   ? Activity Tolerance Patient tolerated treatment well   ? Behavior During Therapy [facility] for tasks assessed/performed   ? ?  ?  ? ?  ? ? ? ? ? ? ? ? ?Past Medical History:  ?Diagnosis Date  ? Diabetes mellitus without complication ([place])   ? High cholesterol   ? Hypertension   ? ? ?History reviewed. No pertinent surgical history. ? ?There were no vitals filed for this visit. ? ? Subjective Assessment - 10/08/21 0825   ? ? Subjective Pt states that she is doing well today. No resting pain upon arrival but was having some throbbing pain last night. No specific concerns upon arrival.   ? Pertinent History Pt was in [place] in July [place], she fell to the right and struck the wall with her RUE. She had immediate pain in her R shoulder. The pain slowly improved on anti-inflammatories however persisted until she eventually went to [facility]. 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 She was previously seeing physical therapy for balance, dizziness, and L sided weakness s/p CVA x 3 however would like to address her R shoulder pain and weakness at this time. She has a return visit with orthopedics on 08/04/21.   ? Limitations Lifting   ? Diagnostic tests See history   ? Patient Stated Goals Improved R shoulder  strength, range of motion, and pain   ? Currently in Pain? No/denies   ? ?  ?  ? ?  ? ? ? ? ? ? ? ? ? ?TREATMENT ? ? ?Ther-ex  ?UBE 4 minutes (2 min forward/2 min backward) for warm-up during history (2 minutes unbilled); ?Supine R shoulder flexion 3# DB 2 x 10 ?Supine R shoulder circles with 3# DB 2 x 10; ?Supine R shoulder serratus punch with 3# DB 2 x 10; ?Supine R elbow flexion with 3# DB 2 x 10; ?Supine R elbow manually resisted extension 2 x 10; ?Seated Nautilus lat pull down 40# 2 x 10; ?Seated Nautilus rows 30# 2 x 10; ? ? ?Manual Therapy  ?Gentle right shoulder passive range of motion into flexion, abduction, and external rotation ?R shoulder GH A/P mobilizations at neutral grade I-II, 30s/bout x 3 bouts ?R shoulder GH A/P mobilizations at 90 abduction and pain-free end range external rotation grade I-II, 30s/bout x 3 bouts ?R shoulder GH inferior mobilizations at 90 abduction grade I-II, 30s/bout x 3 bouts ?R shoulder GH inferior mobilization at end range abduction grade I-II, 30s/bout x 3 ? ? ?Pt educated throughout session about proper posture and technique with exercises. Improved exercise technique, movement at target joints, use of target muscles after min to mod verbal, visual, tactile cues.  ? ? ?Focused session  on progression of light load strengthening in supine, sidelying, and prone positions. Increased weight is challenging for pt however she is able to complete all exercises as instructed. Mild pain reported during prone "Y's" but abolished with light assist from therapist. Pt would benefit from continued PT services in order to address deficits in strength, ROM and pain in order to return to pain free function at home. ? ? ? ? ? PT Short Term Goals - 09/16/21 1345   ? ?  ? PT SHORT TERM GOAL #1  ? Title Pt will be independent with HEP to improve strength, range of motion, and pain in order to decrease pain and improve function at home.   ? Time 4   ? Period Weeks   ? Status On-going   ? Target  Date 10/14/21   ? ?  ?  ? ?  ? ? ? ? PT Long Term Goals - 09/16/21 1345   ? ?  ? PT LONG TERM GOAL #1  ? Title Pt will report worst R shoulder pain as no more than 4/10 in order to demonstrate clinically significant improvement in shoulder pain.   ? Baseline 07/21/21: worst: 7/10; 08/24/21: 8/10 (short duration, pain subsides quicker);   ? Time 8   ? Period Weeks   ? Status Deferred   ? Target Date 11/11/21   ?  ? PT LONG TERM GOAL #2  ? Title Pt will improve R shoulder FOTO to at least 60 in order to demonstrate improvement in function related to her R shoulder   ? Baseline 07/21/21: 43 08/24/21: 68; 09/16/21: 54   ? Time 8   ? Period Weeks   ? Status Partially Met   ? Target Date 11/11/21   ?  ? PT LONG TERM GOAL #3  ? Title Pt will decrease quick DASH score by at least 8% in order to demonstrate clinically significant reduction in disability.   ? Baseline 07/21/21: 70.5% 08/24/21: 13.6%; 09/16/21: 30%   ? Time 8   ? Period Weeks   ? Status Achieved   ?  ? PT LONG TERM GOAL #4  ? Title Pt will increase pain-free strength of R shoulder flexion and abduction to at least 4+/5 in order to demonstrate improvement in strength and function when using R shoulder   ? Baseline 07/21/21: 3+/5 for both flexion and abduction; 08/24/21: 4/5 for both flexion and abduction; 09/16/21: 4/5 flexion, 4+/5 abduction   ? Time 8   ? Period Weeks   ? Status Partially Met   ? Target Date 11/11/21   ? ?  ?  ? ?  ? ? ? ? ? ? ? ? Plan - 10/08/21 0826   ? ? Clinical Impression Statement Focused session on progression of strengthening. Utilized Surveyor, minerals during session today for increased loading. Also continued with manual techniques for shoulder mobility. Pt is able to complete a warm-up on the UBE without any increase in pain. Pt would benefit from continued PT services in order to address deficits in strength, ROM and pain in order to return to pain free function at home.   ? Personal Factors and Comorbidities Age;Comorbidity 3+   ?  Comorbidities DM, HTN, hyperlipidemia, L RTC tear, CVA x 3   ? Examination-Activity Limitations Caring for Others;Lift;Reach Overhead;Dressing   ? Examination-Participation Restrictions Church;Community Activity;Shop;Laundry;Volunteer   ? Stability/Clinical Decision Making Evolving/Moderate complexity   ? Rehab Potential Fair   ? PT Frequency 2x / week   ?  PT Duration 8 weeks   ? PT Treatment/Interventions ADLs/Self Care Home Management;Aquatic Therapy;Biofeedback;Canalith Repostioning;Cryotherapy;Electrical Stimulation;Iontophoresis 50m/ml Dexamethasone;Moist Heat;Traction;Ultrasound;DME Instruction;Gait training;Stair training;Functional mobility training;Therapeutic activities;Therapeutic exercise;Balance training;Neuromuscular re-education;Patient/family education;Manual techniques;Passive range of motion;Dry needling;Vestibular;Joint Manipulations;Spinal Manipulations   ? PT Next Visit Plan Review HEP for R shoulder, continue range of motion, manual techniques, and strengthening   ? PT Home Exercise Plan Access Code: [email or web address](R shoulder) Access Code: [email or web address](balance and weakness from prior episode);   ? Consulted and Agree with Plan of Care Patient   ? ?  ?  ? ?  ? ? ? ? ? ? ? ? ? ? ?Patient will benefit from skilled therapeutic intervention in order to improve the following deficits and impairments:  Decreased strength, Decreased range of motion, Impaired UE functional use, Pain ? ?Visit Diagnosis: ?Stiffness of right shoulder, not elsewhere classified ? ?Chronic right shoulder pain ? ? ? ? ?Problem List ?There are no problems to display for this patient. ? ? ?JLyndel SafeHuprich PT, DPT, GCS  ?[place] ?Physical Therapist- [place] ?[facility] ?[facility] ?[address] ?[place] [address]?Phone: [phone]  Fax:  [phone]? ?Name: Anna Nichols?MRN: [id number]?Date of Birth: [birth year]? ? ? ?

## 2021-10-12 NOTE — Therapy (Signed)
[place] ?[facility] ?[address] [place], [place], [address] ?Phone: [phone]   Fax:  [phone] ? ?Physical Therapy Treatment ? ? ?Patient Details  ?Name: Anna Nichols ?MRN: [id number] ?Date of Birth: 15-May-1961 ?No data recorded ? ?Encounter Date: 10/13/2021 ? ? PT End of Session - 10/13/21 1323   ? ? Visit Number 24   ? Number of Visits 33   ? Date for PT Re-Evaluation 11/11/21   ? Authorization Type eval: 07/21/21   ? Authorization Time Period Medicare primary; medicaid   ? PT Start Time 8676   ? PT Stop Time 7209   ? PT Time Calculation (min) 45 min   ? Activity Tolerance Patient tolerated treatment well   ? Behavior During Therapy [facility] for tasks assessed/performed   ? ?  ?  ? ?  ? ? ? ? ? ? ? ? ? ?Past Medical History:  ?Diagnosis Date  ? Diabetes mellitus without complication ([place])   ? High cholesterol   ? Hypertension   ? ? ?History reviewed. No pertinent surgical history. ? ?There were no vitals filed for this visit. ? ? Subjective Assessment - 10/13/21 1307   ? ? Subjective Pt states that she is doing alright today but is having some more soreness in her shoulder currently due to the cold weather. She rates her pain as 5/10 currently. No specific concerns upon arrival.   ? Pertinent History Pt was in [place] in July 2022, she fell to the right and struck the wall with her RUE. She had immediate pain in her R shoulder. The pain slowly improved on anti-inflammatories however persisted until she eventually went to [facility]. 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 She was previously seeing physical therapy for balance, dizziness, and L sided weakness s/p CVA x 3 however would like to address her R shoulder pain and weakness at this time. She has a return visit with orthopedics on 08/04/21.   ? Limitations Lifting   ? Diagnostic tests See history    ? Patient Stated Goals Improved R shoulder strength, range of motion, and pain   ? ?  ?  ? ?  ? ? ? ? ? ? ?TREATMENT ? ? ?Ther-ex  ?UBE 4 minutes (2 min forward/2 min backward) for warm-up during history (2 minutes unbilled); ?Seated Nautilus lat pull down 40# x 10, 50# x 10; ?Seated Nautilus rows 30# 2 x 10; ?Standing Nautilus chest press 20# 2 x 10 (tried 30# first but too heavy); ?Seated R shoulder flexion 2# dumbbell (DB) 2 x 10; ?Seated R shoulder abduction 2# DB 2 x 10; ?Nautilus R shoulder IR and ER 10# 2 x 10 each; ? ? ?Manual Therapy  ?Gentle right shoulder passive range of motion into flexion, abduction, and external rotation ?R shoulder GH A/P mobilizations at neutral grade I-II, 30s/bout x 3 bouts ?R shoulder GH A/P mobilizations at 90 abduction and pain-free end range external rotation grade I-II, 30s/bout x 3 bouts ?R shoulder GH inferior mobilizations at 90 abduction grade I-II, 30s/bout x 3 bouts ?R shoulder GH inferior mobilization at end range abduction grade I-II, 30s/bout x 3 ? ? ?Pt educated throughout session about proper posture and technique with exercises. Improved exercise technique, movement at target joints, use of target muscles after min to mod verbal, visual, tactile cues.  ? ? ?Focused session on progression of  strengthening during session today. Progressed resistance and utilized the Nautilus machine for increased loading. Pt is demonstrating improved strength with less pain during session. Pt would benefit from continued PT services in order to address deficits in strength, ROM and pain in order to return to pain free function at home. ? ? ? ? ? ? ? PT Short Term Goals - 09/16/21 1345   ? ?  ? PT SHORT TERM GOAL #1  ? Title Pt will be independent with HEP to improve strength, range of motion, and pain in order to decrease pain and improve function at home.   ? Time 4   ? Period Weeks   ? Status On-going   ? Target Date 10/14/21   ? ?  ?  ? ?  ? ? ? ? PT Long Term Goals - 09/16/21  1345   ? ?  ? PT LONG TERM GOAL #1  ? Title Pt will report worst R shoulder pain as no more than 4/10 in order to demonstrate clinically significant improvement in shoulder pain.   ? Baseline 07/21/21: worst: 7/10; 08/24/21: 8/10 (short duration, pain subsides quicker);   ? Time 8   ? Period Weeks   ? Status Deferred   ? Target Date 11/11/21   ?  ? PT LONG TERM GOAL #2  ? Title Pt will improve R shoulder FOTO to at least 60 in order to demonstrate improvement in function related to her R shoulder   ? Baseline 07/21/21: 43 08/24/21: 68; 09/16/21: 54   ? Time 8   ? Period Weeks   ? Status Partially Met   ? Target Date 11/11/21   ?  ? PT LONG TERM GOAL #3  ? Title Pt will decrease quick DASH score by at least 8% in order to demonstrate clinically significant reduction in disability.   ? Baseline 07/21/21: 70.5% 08/24/21: 13.6%; 09/16/21: 30%   ? Time 8   ? Period Weeks   ? Status Achieved   ?  ? PT LONG TERM GOAL #4  ? Title Pt will increase pain-free strength of R shoulder flexion and abduction to at least 4+/5 in order to demonstrate improvement in strength and function when using R shoulder   ? Baseline 07/21/21: 3+/5 for both flexion and abduction; 08/24/21: 4/5 for both flexion and abduction; 09/16/21: 4/5 flexion, 4+/5 abduction   ? Time 8   ? Period Weeks   ? Status Partially Met   ? Target Date 11/11/21   ? ?  ?  ? ?  ? ? ? ? ? ? ? ? Plan - 10/13/21 1323   ? ? Clinical Impression Statement Focused session on progression of strengthening during session today. Progressed resistance and utilized the Nautilus machine for increased loading. Pt is demonstrating improved strength with less pain during session. Pt would benefit from continued PT services in order to address deficits in strength, ROM and pain in order to return to pain free function at home.   ? Personal Factors and Comorbidities Age;Comorbidity 3+   ? Comorbidities DM, HTN, hyperlipidemia, L RTC tear, CVA x 3   ? Examination-Activity Limitations Caring for  Others;Lift;Reach Overhead;Dressing   ? Examination-Participation Restrictions Church;Community Activity;Shop;Laundry;Volunteer   ? Stability/Clinical Decision Making Evolving/Moderate complexity   ? Rehab Potential Fair   ? PT Frequency 2x / week   ? PT Duration 8 weeks   ? PT Treatment/Interventions ADLs/Self Care Home Management;Aquatic Therapy;Biofeedback;Canalith Repostioning;Cryotherapy;Electrical Stimulation;Iontophoresis 4m/ml Dexamethasone;Moist Heat;Traction;Ultrasound;DME Instruction;Gait training;Stair training;Functional mobility training;Therapeutic  activities;Therapeutic exercise;Balance training;Neuromuscular re-education;Patient/family education;Manual techniques;Passive range of motion;Dry needling;Vestibular;Joint Manipulations;Spinal Manipulations   ? PT Next Visit Plan Review HEP for R shoulder, continue range of motion, manual techniques, and strengthening   ? PT Home Exercise Plan Access Code: [email or web address] (R shoulder) Access Code: [email or web address] (balance and weakness from prior episode);   ? Consulted and Agree with Plan of Care Patient   ? ?  ?  ? ?  ? ? ? ? ? ? ? ? ? ? ? ?Patient will benefit from skilled therapeutic intervention in order to improve the following deficits and impairments:  Decreased strength, Decreased range of motion, Impaired UE functional use, Pain ? ?Visit Diagnosis: ?Stiffness of right shoulder, not elsewhere classified ? ?Chronic right shoulder pain ? ?Muscle weakness (generalized) ? ? ? ? ?Problem List ?There are no problems to display for this patient. ? ? ?Lyndel Safe Laurice Kimmons PT, DPT, GCS  ?[place] ?Physical Therapist- [place]  ?[facility]  ?[facility] ?[address] [place], [place], [address] ?Phone: [phone]   Fax:  [phone] ? ?Name: Keerthana Vanrossum ?MRN: [id number] ?Date of Birth: [birth year] ? ? ? ?

## 2021-10-13 ENCOUNTER — Other Ambulatory Visit: Payer: Self-pay

## 2021-10-13 ENCOUNTER — Ambulatory Visit: Payer: Medicare Other

## 2021-10-13 DIAGNOSIS — M25611 Stiffness of right shoulder, not elsewhere classified: Secondary | ICD-10-CM

## 2021-10-13 DIAGNOSIS — M6281 Muscle weakness (generalized): Secondary | ICD-10-CM

## 2021-10-13 DIAGNOSIS — G8929 Other chronic pain: Secondary | ICD-10-CM

## 2021-10-20 ENCOUNTER — Ambulatory Visit: Payer: Medicare Other

## 2021-10-20 ENCOUNTER — Other Ambulatory Visit: Payer: Self-pay

## 2021-10-20 DIAGNOSIS — G8929 Other chronic pain: Secondary | ICD-10-CM

## 2021-10-20 DIAGNOSIS — M25611 Stiffness of right shoulder, not elsewhere classified: Secondary | ICD-10-CM

## 2021-10-20 DIAGNOSIS — M6281 Muscle weakness (generalized): Secondary | ICD-10-CM

## 2021-10-20 NOTE — Therapy (Signed)
[place] ?[facility] ?[address] [place], [place], [address] ?Phone: [phone]   Fax:  [phone] ? ?Physical Therapy Treatment ? ? ?Patient Details  ?Name: Anna Nichols ?MRN: [id number] ?Date of Birth: July 30, 1961 ?No data recorded ? ?Encounter Date: 10/20/2021 ? ? PT End of Session - 10/20/21 1345   ? ? Visit Number 25   ? Number of Visits 33   ? Date for PT Re-Evaluation 11/11/21   ? Authorization Type eval: 07/21/21   ? Authorization Time Period Medicare primary; medicaid   ? PT Start Time 4142   ? PT Stop Time 1423   ? PT Time Calculation (min) 45 min   ? Activity Tolerance Patient tolerated treatment well   ? Behavior During Therapy [facility] for tasks assessed/performed   ? ?  ?  ? ?  ? ? ? ? ?Past Medical History:  ?Diagnosis Date  ? Diabetes mellitus without complication ([place])   ? High cholesterol   ? Hypertension   ? ? ?History reviewed. No pertinent surgical history. ? ?There were no vitals filed for this visit. ? ? Subjective Assessment - 10/20/21 1342   ? ? Subjective Pt reports that her R shoulder has been painful over the last couple days with the cold weather but reports only 2/10 pain upon arrival currently. No specific questions currently.   ? Pertinent History Pt was in [place] in July 2022, she fell to the right and struck the wall with her RUE. She had immediate pain in her R shoulder. The pain slowly improved on anti-inflammatories however persisted until she eventually went to [facility]. 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 She was previously seeing physical therapy for balance, dizziness, and L sided weakness s/p CVA x 3 however would like to address her R shoulder pain and weakness at this time. She has a return visit with orthopedics on 08/04/21.   ? Limitations Lifting   ? Diagnostic tests See history   ? Patient Stated Goals  Improved R shoulder strength, range of motion, and pain   ? ?  ?  ? ?  ? ? ? ? ? ? ? ?TREATMENT ? ? ?Ther-ex  ?UBE 4 minutes (2 min forward/2 min backward) for warm-up during history (2 minutes unbilled); ?Seated R shoulder flexion 3# dumbbell (DB) 2 x 10; ?Seated R shoulder abduction 3# DB 2 x 10; ?Seated R shoulder overhead press with 2# DB 2 x 10; ?Incline push-ups on // bars 2 x 10; ?Seated Nautilus lat pull down 50# 2 x 10; ?Seated Nautilus rows 30# 2 x 10; ?Standing Nautilus R bicep curls 10# 2 x 10; ?Seated R tricep extension with green tband 2 x 10; ?Nautilus R shoulder IR and ER 10# x 10 each; ? ? ?Manual Therapy  ?Gentle right shoulder passive range of motion into flexion, abduction, and external rotation ?R shoulder GH A/P mobilizations at neutral grade I-II, 30s/bout x 3 bouts ?R shoulder GH A/P mobilizations at 90 abduction and pain-free end range external rotation grade I-II, 30s/bout x 3 bouts ?R shoulder GH inferior mobilizations at 90 abduction grade I-II, 30s/bout x 3 bouts ?R shoulder GH inferior mobilization at end range abduction grade I-II, 30s/bout x 3 ? ? ?Pt educated throughout session about proper posture and technique with exercises. Improved exercise technique, movement at target joints, use of target muscles after min to mod  verbal, visual, tactile cues.  ? ? ?Focused session on progression of strengthening during session today. Progressed dumbbells to 3# and pt is able to complete exercises even though they are more challenging. Utilized the [employer] again for increased loading. Pt is demonstrating improved strength with less pain during session. Pt would benefit from continued PT services in order to address deficits in strength, ROM and pain in order to return to pain free function at home. ? ? ? ? ? ? ? PT Short Term Goals - 09/16/21 1345   ? ?  ? PT SHORT TERM GOAL #1  ? Title Pt will be independent with HEP to improve strength, range of motion, and pain in order to decrease  pain and improve function at home.   ? Time 4   ? Period Weeks   ? Status On-going   ? Target Date 10/14/21   ? ?  ?  ? ?  ? ? ? ? PT Long Term Goals - 09/16/21 1345   ? ?  ? PT LONG TERM GOAL #1  ? Title Pt will report worst R shoulder pain as no more than 4/10 in order to demonstrate clinically significant improvement in shoulder pain.   ? Baseline 07/21/21: worst: 7/10; 08/24/21: 8/10 (short duration, pain subsides quicker);   ? Time 8   ? Period Weeks   ? Status Deferred   ? Target Date 11/11/21   ?  ? PT LONG TERM GOAL #2  ? Title Pt will improve R shoulder FOTO to at least 60 in order to demonstrate improvement in function related to her R shoulder   ? Baseline 07/21/21: 43 08/24/21: 68; 09/16/21: 54   ? Time 8   ? Period Weeks   ? Status Partially Met   ? Target Date 11/11/21   ?  ? PT LONG TERM GOAL #3  ? Title Pt will decrease quick DASH score by at least 8% in order to demonstrate clinically significant reduction in disability.   ? Baseline 07/21/21: 70.5% 08/24/21: 13.6%; 09/16/21: 30%   ? Time 8   ? Period Weeks   ? Status Achieved   ?  ? PT LONG TERM GOAL #4  ? Title Pt will increase pain-free strength of R shoulder flexion and abduction to at least 4+/5 in order to demonstrate improvement in strength and function when using R shoulder   ? Baseline 07/21/21: 3+/5 for both flexion and abduction; 08/24/21: 4/5 for both flexion and abduction; 09/16/21: 4/5 flexion, 4+/5 abduction   ? Time 8   ? Period Weeks   ? Status Partially Met   ? Target Date 11/11/21   ? ?  ?  ? ?  ? ? ? ? ? ? ? ? Plan - 10/20/21 1352   ? ? Clinical Impression Statement Focused session on progression of strengthening during session today. Progressed dumbbells to 3# and pt is able to complete exercises even though they are more challenging. Utilized the [employer] again for increased loading. Pt is demonstrating improved strength with less pain during session. Pt would benefit from continued PT services in order to address deficits in  strength, ROM and pain in order to return to pain free function at home.   ? Personal Factors and Comorbidities Age;Comorbidity 3+   ? Comorbidities DM, HTN, hyperlipidemia, L RTC tear, CVA x 3   ? Examination-Activity Limitations Caring for Others;Lift;Reach Overhead;Dressing   ? Examination-Participation Restrictions Church;Community Activity;Shop;Laundry;Volunteer   ? Stability/Clinical Decision Making Evolving/Moderate  complexity   ? Rehab Potential Fair   ? PT Frequency 2x / week   ? PT Duration 8 weeks   ? PT Treatment/Interventions ADLs/Self Care Home Management;Aquatic Therapy;Biofeedback;Canalith Repostioning;Cryotherapy;Electrical Stimulation;Iontophoresis 67m/ml Dexamethasone;Moist Heat;Traction;Ultrasound;DME Instruction;Gait training;Stair training;Functional mobility training;Therapeutic activities;Therapeutic exercise;Balance training;Neuromuscular re-education;Patient/family education;Manual techniques;Passive range of motion;Dry needling;Vestibular;Joint Manipulations;Spinal Manipulations   ? PT Next Visit Plan Review HEP for R shoulder, continue range of motion, manual techniques, and strengthening   ? PT Home Exercise Plan Access Code: [email or web address](R shoulder) Access Code: [email or web address](balance and weakness from prior episode);   ? Consulted and Agree with Plan of Care Patient   ? ?  ?  ? ?  ? ? ? ? ? ? ? ? ? ? ? ? ?Patient will benefit from skilled therapeutic intervention in order to improve the following deficits and impairments:  Decreased strength, Decreased range of motion, Impaired UE functional use, Pain ? ?Visit Diagnosis: ?Stiffness of right shoulder, not elsewhere classified ? ?Chronic right shoulder pain ? ?Muscle weakness (generalized) ? ? ? ? ?Problem List ?There are no problems to display for this patient. ? ? ?JLyndel SafeHuprich PT, DPT, GCS  ?[place] ?Physical Therapist- [place] ?[facility] ?[address] ?[place] [address]?Phone: [phone]  Fax:  [phone]? ?Name: MLaraya Pestka?MRN: [id number]?Date of Birth: 112-23-1962? ? ? ?

## 2021-10-21 NOTE — Therapy (Signed)
[place] ?[facility] ?[address]. [place], [place], [address] ?Phone: [phone]   Fax:  [phone] ? ?Physical Therapy Treatment ? ? ?Patient Details  ?Name: Anna [place] ?MRN: [id number] ?Date of Birth: 10/23/1960 ?No data recorded ? ?Encounter Date: 10/22/2021 ? ? PT End of Session - 10/22/21 0907   ? ? Visit Number 26   ? Number of Visits 33   ? Date for PT Re-Evaluation 11/11/21   ? Authorization Type eval: 07/21/21   ? Authorization Time Period Medicare primary; medicaid   ? PT Start Time [phone]   ? PT Stop Time 0998   ? PT Time Calculation (min) 45 min   ? Activity Tolerance Patient tolerated treatment well   ? Behavior During Therapy [facility] for tasks assessed/performed   ? ?  ?  ? ?  ? ? ? ? ? ?Past Medical History:  ?Diagnosis Date  ? Diabetes mellitus without complication ([place])   ? High cholesterol   ? Hypertension   ? ? ?History reviewed. No pertinent surgical history. ? ?There were no vitals filed for this visit. ? ? Subjective Assessment - 10/22/21 0907   ? ? Subjective Pt reports that her R shoulder has been doing well. She denies any pain upon arrival. She went to [facility] [facility] yesterday and was able to do the UBE. No specific questions currently.   ? Pertinent History Pt was in [place] in July 2022, she fell to the right and struck the wall with her RUE. She had immediate pain in her R shoulder. The pain slowly improved on anti-inflammatories however persisted until she eventually went to [facility]. 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 She was previously seeing physical therapy for balance, dizziness, and L sided weakness s/p CVA x 3 however would like to address her R shoulder pain and weakness at this time. She has a return visit with orthopedics on 08/04/21.   ? Limitations Lifting   ? Diagnostic tests See history   ? Patient Stated  Goals Improved R shoulder strength, range of motion, and pain   ? Currently in Pain? No/denies   ? ?  ?  ? ?  ? ? ? ? ? ?TREATMENT ? ? ?Ther-ex  ?UBE 4 minutes (2 min forward/2 min backward) for warm-up during history (2 minutes unbilled); ?Seated R shoulder overhead press with 2# DB 2 x 10; ?Seated Nautilus lat pull down 50# x 10, 60# x 10; ?Standing Nautilus tricep press down 30# 2 x 10; ?Seated Nautilus rows 30# 2 x 10; ?Prone elbow knee planks 15s hold/15s relax x 3; ?Prone elbow knee planks "stirring the pot" x 15s; ?Seated pulleys for R shoulder flexion and abduction stretches with 5-8s hold at end range and therapist modifying positioning; ? ? ?Manual Therapy  ?Gentle right shoulder passive range of motion into flexion, abduction, and external rotation ?R shoulder GH A/P mobilizations at neutral grade I-II, 30s/bout x 3 bouts ?R shoulder GH A/P mobilizations at 90 abduction and pain-free end range external rotation grade I-II, 30s/bout x 3 bouts ?R shoulder GH inferior mobilizations at 90 abduction grade I-II, 30s/bout x 3 bouts ?R shoulder GH inferior mobilization at end range abduction grade I-II, 30s/bout x 3 ? ? ?Pt educated throughout session about proper posture and technique with exercises. Improved exercise technique, movement at target joints, use of target muscles after min to  mod verbal, visual, tactile cues.  ? ? ?Focused session on progression of strengthening during session today. Utilized the [employer] again for increased loading and introduced some additional closed chain RUE such as knee elbow front planks. Pt is demonstrating improved strength with less pain during session. She would benefit from continued PT services in order to address deficits in strength, ROM and pain in order to return to pain free function at home. ? ? ? ? ? ? ? PT Short Term Goals - 09/16/21 1345   ? ?  ? PT SHORT TERM GOAL #1  ? Title Pt will be independent with HEP to improve strength, range of motion, and  pain in order to decrease pain and improve function at home.   ? Time 4   ? Period Weeks   ? Status On-going   ? Target Date 10/14/21   ? ?  ?  ? ?  ? ? ? ? PT Long Term Goals - 09/16/21 1345   ? ?  ? PT LONG TERM GOAL #1  ? Title Pt will report worst R shoulder pain as no more than 4/10 in order to demonstrate clinically significant improvement in shoulder pain.   ? Baseline 07/21/21: worst: 7/10; 08/24/21: 8/10 (short duration, pain subsides quicker);   ? Time 8   ? Period Weeks   ? Status Deferred   ? Target Date 11/11/21   ?  ? PT LONG TERM GOAL #2  ? Title Pt will improve R shoulder FOTO to at least 60 in order to demonstrate improvement in function related to her R shoulder   ? Baseline 07/21/21: 43 08/24/21: 68; 09/16/21: 54   ? Time 8   ? Period Weeks   ? Status Partially Met   ? Target Date 11/11/21   ?  ? PT LONG TERM GOAL #3  ? Title Pt will decrease quick DASH score by at least 8% in order to demonstrate clinically significant reduction in disability.   ? Baseline 07/21/21: 70.5% 08/24/21: 13.6%; 09/16/21: 30%   ? Time 8   ? Period Weeks   ? Status Achieved   ?  ? PT LONG TERM GOAL #4  ? Title Pt will increase pain-free strength of R shoulder flexion and abduction to at least 4+/5 in order to demonstrate improvement in strength and function when using R shoulder   ? Baseline 07/21/21: 3+/5 for both flexion and abduction; 08/24/21: 4/5 for both flexion and abduction; 09/16/21: 4/5 flexion, 4+/5 abduction   ? Time 8   ? Period Weeks   ? Status Partially Met   ? Target Date 11/11/21   ? ?  ?  ? ?  ? ? ? ? ? ? ? ? Plan - 10/22/21 0908   ? ? Clinical Impression Statement Focused session on progression of strengthening during session today. Utilized the [employer] again for increased loading and introduced some additional closed chain RUE such as knee elbow front planks. Pt is demonstrating improved strength with less pain during session. She would benefit from continued PT services in order to address deficits  in strength, ROM and pain in order to return to pain free function at home.   ? Personal Factors and Comorbidities Age;Comorbidity 3+   ? Comorbidities DM, HTN, hyperlipidemia, L RTC tear, CVA x 3   ? Examination-Activity Limitations Caring for Others;Lift;Reach Overhead;Dressing   ? Examination-Participation Restrictions Church;Community Activity;Shop;Laundry;Volunteer   ? Stability/Clinical Decision Making Evolving/Moderate complexity   ? Rehab Potential Fair   ?  PT Frequency 2x / week   ? PT Duration 8 weeks   ? PT Treatment/Interventions ADLs/Self Care Home Management;Aquatic Therapy;Biofeedback;Canalith Repostioning;Cryotherapy;Electrical Stimulation;Iontophoresis 33m/ml Dexamethasone;Moist Heat;Traction;Ultrasound;DME Instruction;Gait training;Stair training;Functional mobility training;Therapeutic activities;Therapeutic exercise;Balance training;Neuromuscular re-education;Patient/family education;Manual techniques;Passive range of motion;Dry needling;Vestibular;Joint Manipulations;Spinal Manipulations   ? PT Next Visit Plan Review HEP for R shoulder, continue range of motion, manual techniques, and strengthening   ? PT Home Exercise Plan Access Code: [email or web address](R shoulder) Access Code: [email or web address](balance and weakness from prior episode);   ? Consulted and Agree with Plan of Care Patient   ? ?  ?  ? ?  ? ? ? ? ? ? ? ? ? ? ? ? ? ?Patient will benefit from skilled therapeutic intervention in order to improve the following deficits and impairments:  Decreased strength, Decreased range of motion, Impaired UE functional use, Pain ? ?Visit Diagnosis: ?Stiffness of right shoulder, not elsewhere classified ? ?Chronic right shoulder pain ? ?Muscle weakness (generalized) ? ? ? ? ?Problem List ?There are no problems to display for this patient. ? ? ?JLyndel SafeHuprich PT, DPT, GCS  ?[place] ?Physical Therapist- [place] ?[facility] ?[address] ?[place] [address]?Phone: [phone]  Fax:  [phone]? ?Name: MAvantika Shere?MRN: [id number]?Date of Birth: [birth year]? ? ? ?

## 2021-10-22 ENCOUNTER — Other Ambulatory Visit: Payer: Self-pay

## 2021-10-22 ENCOUNTER — Ambulatory Visit: Payer: Medicare Other

## 2021-10-22 DIAGNOSIS — G8929 Other chronic pain: Secondary | ICD-10-CM

## 2021-10-22 DIAGNOSIS — M25611 Stiffness of right shoulder, not elsewhere classified: Secondary | ICD-10-CM

## 2021-10-22 DIAGNOSIS — M6281 Muscle weakness (generalized): Secondary | ICD-10-CM

## 2021-10-25 NOTE — Therapy (Incomplete)
[place] ?[facility] ?[address]. [place], [place], [address] ?Phone: [phone]   Fax:  [phone] ? ?Physical Therapy Treatment ? ? ?Patient Details  ?Name: Anna [place] ?MRN: [id number] ?Date of Birth: [birth year] ?No data recorded ? ?Encounter Date: 10/26/2021 ? ? ? ? ? ? ? ?Past Medical History:  ?Diagnosis Date  ? Diabetes mellitus without complication ([place])   ? High cholesterol   ? Hypertension   ? ? ?No past surgical history on file. ? ?There were no vitals filed for this visit. ? ? ? ? ? ? ? ?TREATMENT ? ? ?Ther-ex  ?UBE 4 minutes (2 min forward/2 min backward) for warm-up during history (2 minutes unbilled); ?Seated R shoulder overhead press with 2# DB 2 x 10; ?Seated Nautilus lat pull down 50# x 10, 60# x 10; ?Standing Nautilus tricep press down 30# 2 x 10; ?Seated Nautilus rows 30# 2 x 10; ?Prone elbow knee planks 15s hold/15s relax x 3; ?Prone elbow knee planks "stirring the pot" x 15s; ?Seated pulleys for R shoulder flexion and abduction stretches with 5-8s hold at end range and therapist modifying positioning; ? ? ?Manual Therapy  ?Gentle right shoulder passive range of motion into flexion, abduction, and external rotation ?R shoulder GH A/P mobilizations at neutral grade I-II, 30s/bout x 3 bouts ?R shoulder GH A/P mobilizations at 90 abduction and pain-free end range external rotation grade I-II, 30s/bout x 3 bouts ?R shoulder GH inferior mobilizations at 90 abduction grade I-II, 30s/bout x 3 bouts ?R shoulder GH inferior mobilization at end range abduction grade I-II, 30s/bout x 3 ? ? ?Pt educated throughout session about proper posture and technique with exercises. Improved exercise technique, movement at target joints, use of target muscles after min to mod verbal, visual, tactile cues.  ? ? ?Focused session on progression of strengthening during session today. Utilized the [employer] again for increased loading and introduced some additional closed  chain RUE such as knee elbow front planks. Pt is demonstrating improved strength with less pain during session. She would benefit from continued PT services in order to address deficits in strength, ROM and pain in order to return to pain free function at home. ? ? ? ? ? ? ? PT Short Term Goals - 09/16/21 1345   ? ?  ? PT SHORT TERM GOAL #1  ? Title Pt will be independent with HEP to improve strength, range of motion, and pain in order to decrease pain and improve function at home.   ? Time 4   ? Period Weeks   ? Status On-going   ? Target Date 10/14/21   ? ?  ?  ? ?  ? ? ? ? PT Long Term Goals - 09/16/21 1345   ? ?  ? PT LONG TERM GOAL #1  ? Title Pt will report worst R shoulder pain as no more than 4/10 in order to demonstrate clinically significant improvement in shoulder pain.   ? Baseline 07/21/21: worst: 7/10; 08/24/21: 8/10 (short duration, pain subsides quicker);   ? Time 8   ? Period Weeks   ? Status Deferred   ? Target Date 11/11/21   ?  ? PT LONG TERM GOAL #2  ? Title Pt will improve R shoulder FOTO to at least 60 in order to demonstrate improvement in function related to her R shoulder   ? Baseline 07/21/21: 43 08/24/21: 68; 09/16/21: 54   ? Time 8   ? Period Weeks   ?  Status Partially Met   ? Target Date 11/11/21   ?  ? PT LONG TERM GOAL #3  ? Title Pt will decrease quick DASH score by at least 8% in order to demonstrate clinically significant reduction in disability.   ? Baseline 07/21/21: 70.5% 08/24/21: 13.6%; 09/16/21: 30%   ? Time 8   ? Period Weeks   ? Status Achieved   ?  ? PT LONG TERM GOAL #4  ? Title Pt will increase pain-free strength of R shoulder flexion and abduction to at least 4+/5 in order to demonstrate improvement in strength and function when using R shoulder   ? Baseline 07/21/21: 3+/5 for both flexion and abduction; 08/24/21: 4/5 for both flexion and abduction; 09/16/21: 4/5 flexion, 4+/5 abduction   ? Time 8   ? Period Weeks   ? Status Partially Met   ? Target Date 11/11/21   ? ?  ?  ? ?   ? ? ? ? ? ? ? ? ? ? ? ? ? ? ? ? ? ? ? ? ? ?Patient will benefit from skilled therapeutic intervention in order to improve the following deficits and impairments:    ? ?Visit Diagnosis: ?Stiffness of right shoulder, not elsewhere classified ? ?Chronic right shoulder pain ? ?Muscle weakness (generalized) ? ? ? ? ?Problem List ?There are no problems to display for this patient. ? ? ?Lyndel Safe Antowan Samford PT, DPT, GCS  ?[place] ?Physical Therapist- [place]  ?[facility]  ?[facility] ?[address] [place], [place], [address] ?Phone: [phone]   Fax:  [phone] ? ?Name: [place] ?MRN: [id number] ?Date of Birth: [birth year] ? ? ? ?

## 2021-10-26 ENCOUNTER — Ambulatory Visit: Payer: Medicare Other

## 2021-10-26 ENCOUNTER — Other Ambulatory Visit: Payer: Self-pay

## 2021-10-26 DIAGNOSIS — M25611 Stiffness of right shoulder, not elsewhere classified: Secondary | ICD-10-CM

## 2021-10-26 DIAGNOSIS — G8929 Other chronic pain: Secondary | ICD-10-CM

## 2021-10-26 DIAGNOSIS — M6281 Muscle weakness (generalized): Secondary | ICD-10-CM

## 2021-10-26 NOTE — Therapy (Signed)
?OUTPATIENT PHYSICAL THERAPY TREATMENT NOTE ? ? ?Patient Name: Yvette Loveless ?MRN: [id number] ?DOB:[birth year], 61 y.o., female ?Today's Date: 10/26/2021 ? ?PCP: [facility], [facility] ?REFERRING PROVIDER: Anabel Bene, MD ? ? PT End of Session - 10/26/21 0758   ? ? Visit Number 27   ? Number of Visits 33   ? Date for PT Re-Evaluation 11/11/21   ? Authorization Type eval: 07/21/21   ? Authorization Time Period Medicare primary; medicaid   ? PT Start Time 0800   ? PT Stop Time 0845   ? PT Time Calculation (min) 45 min   ? Activity Tolerance Patient tolerated treatment well   ? Behavior During Therapy [facility] for tasks assessed/performed   ? ?  ?  ? ?  ? ? ?Past Medical History:  ?Diagnosis Date  ? Diabetes mellitus without complication ([place])   ? High cholesterol   ? Hypertension   ? ?History reviewed. No pertinent surgical history. ?There are no problems to display for this patient. ? ? ?REFERRING DIAG: R shoulder pain ? ?THERAPY DIAG:  ?Stiffness of right shoulder, not elsewhere classified ? ?Chronic right shoulder pain ? ?Muscle weakness (generalized) ? ?PERTINENT HISTORY: Pt was in [place] in July 2022, she fell to the right and struck the wall with her RUE. She had immediate pain in her R shoulder. The pain slowly improved on anti-inflammatories however persisted until she eventually went to [facility]. 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 She was previously seeing physical therapy for balance, dizziness, and L sided weakness s/p CVA x 3 however would like to address her R shoulder pain and weakness at this time. She has a return visit with orthopedics on 08/04/21. ? ?PRECAUTIONS: None ? ?SUBJECTIVE: Pt reports that her R shoulder has been doing well. She was having some soreness yesterday with the rain but improves today. No specific questions currently ? ?PAIN:  ?Are you having pain?  No ? ? ?TODAY'S TREATMENT:  ? ? ?Ther-ex  ?UBE 4 minutes (2 min forward/2 min backward) for warm-up during history (2 minutes unbilled); ?Supine R shoulder serratus punch with 3# dumbbell 2 x 10; ?Supine R shoulder circles with 3# dumbbell 2 x 10 each direction; ?Supine R rhythmic stabilization 2 x 30s; ?Seated R shoulder overhead press with 3# DB 2 x 10; ?Seated R bicep curls with 4# dumbbell 2 x 10; ?Seated R tricep extension with green tband 2 x 10; ?Seated Nautilus lat pull down 50# x 10, 60# 2 x 10; ?Seated R shoulder flexion with 3# dumbbell x 10; ?Seated R shoulder abduction with 3# dumbbell x 10; ? ? ?Manual Therapy  ?Gentle right shoulder passive range of motion into flexion, abduction, and external rotation ?R shoulder GH A/P mobilizations at neutral grade I-II, 30s/bout x 3 bouts ?R shoulder GH A/P mobilizations at 90 abduction and pain-free end range external rotation grade I-II, 30s/bout x 3 bouts ?R shoulder GH inferior mobilizations at 90 abduction grade I-II, 30s/bout x 3 bouts ?R shoulder GH inferior mobilization at end range abduction grade I-II, 30s/bout x 3 ? ? ? ?PATIENT EDUCATION: ?Education details: Pt educated throughout session about proper posture and technique with exercises. Improved exercise technique, movement at target joints, use of target muscles after min to mod verbal, visual, tactile cues.  ?Person educated: Patient ?Education method: Explanation and Demonstration ?Education comprehension: verbalized understanding and returned demonstration ? ? ?HOME EXERCISE  PROGRAM: ?Access Code: [email or web address] (R shoulder) Access Code: [email or web address] (balance and weakness from prior episode);  ? ? ? ? ? PT Short Term Goals - 09/16/21 1345   ? ?  ? PT SHORT TERM GOAL #1  ? Title Pt will be independent with HEP to improve strength, range of motion, and pain in order to decrease pain and improve function at home.   ? Time 4   ? Period Weeks   ? Status On-going   ? Target Date 10/14/21   ? ?  ?  ? ?  ? ? ? PT  Long Term Goals - 09/16/21 1345   ? ?  ? PT LONG TERM GOAL #1  ? Title Pt will report worst R shoulder pain as no more than 4/10 in order to demonstrate clinically significant improvement in shoulder pain.   ? Baseline 07/21/21: worst: 7/10; 08/24/21: 8/10 (short duration, pain subsides quicker);   ? Time 8   ? Period Weeks   ? Status Deferred   ? Target Date 11/11/21   ?  ? PT LONG TERM GOAL #2  ? Title Pt will improve R shoulder FOTO to at least 60 in order to demonstrate improvement in function related to her R shoulder   ? Baseline 07/21/21: 43 08/24/21: 68; 09/16/21: 54   ? Time 8   ? Period Weeks   ? Status Partially Met   ? Target Date 11/11/21   ?  ? PT LONG TERM GOAL #3  ? Title Pt will decrease quick DASH score by at least 8% in order to demonstrate clinically significant reduction in disability.   ? Baseline 07/21/21: 70.5% 08/24/21: 13.6%; 09/16/21: 30%   ? Time 8   ? Period Weeks   ? Status Achieved   ?  ? PT LONG TERM GOAL #4  ? Title Pt will increase pain-free strength of R shoulder flexion and abduction to at least 4+/5 in order to demonstrate improvement in strength and function when using R shoulder   ? Baseline 07/21/21: 3+/5 for both flexion and abduction; 08/24/21: 4/5 for both flexion and abduction; 09/16/21: 4/5 flexion, 4+/5 abduction   ? Time 8   ? Period Weeks   ? Status Partially Met   ? Target Date 11/11/21   ? ?  ?  ? ?  ? ? ? Plan - 10/26/21 0758   ? ? Clinical Impression Statement Focused session on progression of strengthening during session today. Utilized the [employer] again for increased loading but also utilized dynamic stabilization techniques in supine. Will continue to progress strength in future sessions. Pt is demonstrating improved strength with less pain during session. She would benefit from continued PT services in order to address deficits in strength, ROM and pain in order to return to pain free function at home.  ? Personal Factors and Comorbidities Age;Comorbidity 3+    ? Comorbidities DM, HTN, hyperlipidemia, L RTC tear, CVA x 3   ? Examination-Activity Limitations Caring for Others;Lift;Reach Overhead;Dressing   ? Examination-Participation Restrictions Church;Community Activity;Shop;Laundry;Volunteer   ? Stability/Clinical Decision Making Evolving/Moderate complexity   ? Rehab Potential Fair   ? PT Frequency 2x / week   ? PT Duration 8 weeks   ? PT Treatment/Interventions ADLs/Self Care Home Management;Aquatic Therapy;Biofeedback;Canalith Repostioning;Cryotherapy;Electrical Stimulation;Iontophoresis 52m/ml Dexamethasone;Moist Heat;Traction;Ultrasound;DME Instruction;Gait training;Stair training;Functional mobility training;Therapeutic activities;Therapeutic exercise;Balance training;Neuromuscular re-education;Patient/family education;Manual techniques;Passive range of motion;Dry needling;Vestibular;Joint Manipulations;Spinal Manipulations   ? PT Next Visit Plan Review HEP for R shoulder, continue range of motion, manual techniques,  and strengthening   ? PT Home Exercise Plan Access Code: [email or web address] (R shoulder) Access Code: [email or web address] (balance and weakness from prior episode);   ? Consulted and Agree with Plan of Care Patient   ? ?  ?  ? ?  ? ? ? ?Lyndel Safe Raiden Haydu PT, DPT, GCS  ?Tyana Butzer, PT ?10/26/2021, 8:46 AM ? ?   ?

## 2021-10-29 ENCOUNTER — Ambulatory Visit: Payer: Medicare Other

## 2021-10-29 DIAGNOSIS — M25611 Stiffness of right shoulder, not elsewhere classified: Secondary | ICD-10-CM

## 2021-10-29 DIAGNOSIS — M6281 Muscle weakness (generalized): Secondary | ICD-10-CM

## 2021-10-29 DIAGNOSIS — G8929 Other chronic pain: Secondary | ICD-10-CM

## 2021-10-29 NOTE — Therapy (Signed)
?OUTPATIENT PHYSICAL THERAPY TREATMENT NOTE ? ? ?Patient Name: Anna Nichols ?MRN: [id number] ?DOB:03-19-1961, 61 y.o., female ?Today's Date: 10/29/2021 ? ?PCP: Services, [facility] ?REFERRING PROVIDER: Anabel Bene, MD ? ? PT End of Session - 10/29/21 0910   ? ? Visit Number 28   ? Number of Visits 33   ? Date for PT Re-Evaluation 11/11/21   ? Authorization Type eval: 07/21/21   ? Authorization Time Period Medicare primary; medicaid   ? PT Start Time [id number]   ? PT Stop Time 0930   ? PT Time Calculation (min) 43 min   ? Activity Tolerance Patient tolerated treatment well   ? Behavior During Therapy [facility] for tasks assessed/performed   ? ?  ?  ? ?  ? ? ?Past Medical History:  ?Diagnosis Date  ? Diabetes mellitus without complication ([place])   ? High cholesterol   ? Hypertension   ? ?History reviewed. No pertinent surgical history. ?There are no problems to display for this patient. ? ? ?REFERRING DIAG: R shoulder pain ? ?THERAPY DIAG:  ?Stiffness of right shoulder, not elsewhere classified ? ?Chronic right shoulder pain ? ?Muscle weakness (generalized) ? ?PERTINENT HISTORY: Pt was in [place] in July 2022, she fell to the right and struck the wall with her RUE. She had immediate pain in her R shoulder. The pain slowly improved on anti-inflammatories however persisted until she eventually went to [facility]. 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 She was previously seeing physical therapy for balance, dizziness, and L sided weakness s/p CVA x 3 however would like to address her R shoulder pain and weakness at this time. She has a return visit with orthopedics on 08/04/21. ? ?PRECAUTIONS: None ? ?SUBJECTIVE: Pt reports that her R shoulder has been doing well. No pain upon arrival. No changes since last therapy session. No specific questions currently ? ?PAIN:  ?Are you having pain? No ? ? ?TODAY'S  TREATMENT:  ? ? ?Ther-ex  ?UBE 4 minutes (2 min forward/2 min backward) for warm-up during history (2 minutes unbilled); ?Supine R shoulder serratus punch with 3# dumbbell 2 x 10; ?Supine R shoulder circles with 3# dumbbell 2 x 10 each direction; ?Supine R rhythmic stabilization x 30s; ?L sidelying R shoulder abduction with 3# dumbbell 2 x 10; ?L sidelying R shoulder ER with 3# dumbbell 2 x 10; ?Prone R shoulder extension with 3# dumbbell x 10; ?Prone R shoulder row with 3# dumbbell x 10; ?Prone R elbow extension with 3# dumbbell x 10; ?Prone R shoulder horizontal abduction x 10; ?Prone R shoulder low trap flexion x 10; ? ? ?Manual Therapy  ?Gentle right shoulder passive range of motion into flexion, abduction, and external rotation ?R shoulder GH A/P mobilizations at neutral grade I-II, 30s/bout x 3 bouts ?R shoulder GH A/P mobilizations at 90 abduction and pain-free end range external rotation grade I-II, 30s/bout x 3 bouts ?R shoulder GH inferior mobilizations at 90 abduction grade I-II, 30s/bout x 3 bouts ?R shoulder GH inferior mobilization at end range abduction grade I-II, 30s/bout x 3 ? ? ? ?PATIENT EDUCATION: ?Education details: Pt educated throughout session about proper posture and technique with exercises. Improved exercise technique, movement at target joints, use of target muscles after min to mod verbal, visual, tactile cues.  ?Person educated: Patient ?Education method: Explanation and Demonstration ?Education comprehension: verbalized understanding and returned demonstration ? ? ?HOME EXERCISE  PROGRAM: ?Access Code: [email or web address] (R shoulder) Access Code: [email or web address] (balance and weakness from prior episode);  ? ? ? ? ? PT Short Term Goals - 09/16/21 1345   ? ?  ? PT SHORT TERM GOAL #1  ? Title Pt will be independent with HEP to improve strength, range of motion, and pain in order to decrease pain and improve function at home.   ? Time 4   ? Period Weeks   ? Status On-going   ? Target Date 10/14/21   ? ?  ?   ? ?  ? ? ? PT Long Term Goals - 09/16/21 1345   ? ?  ? PT LONG TERM GOAL #1  ? Title Pt will report worst R shoulder pain as no more than 4/10 in order to demonstrate clinically significant improvement in shoulder pain.   ? Baseline 07/21/21: worst: 7/10; 08/24/21: 8/10 (short duration, pain subsides quicker);   ? Time 8   ? Period Weeks   ? Status Deferred   ? Target Date 11/11/21   ?  ? PT LONG TERM GOAL #2  ? Title Pt will improve R shoulder FOTO to at least 60 in order to demonstrate improvement in function related to her R shoulder   ? Baseline 07/21/21: 43 08/24/21: 68; 09/16/21: 54   ? Time 8   ? Period Weeks   ? Status Partially Met   ? Target Date 11/11/21   ?  ? PT LONG TERM GOAL #3  ? Title Pt will decrease quick DASH score by at least 8% in order to demonstrate clinically significant reduction in disability.   ? Baseline 07/21/21: 70.5% 08/24/21: 13.6%; 09/16/21: 30%   ? Time 8   ? Period Weeks   ? Status Achieved   ?  ? PT LONG TERM GOAL #4  ? Title Pt will increase pain-free strength of R shoulder flexion and abduction to at least 4+/5 in order to demonstrate improvement in strength and function when using R shoulder   ? Baseline 07/21/21: 3+/5 for both flexion and abduction; 08/24/21: 4/5 for both flexion and abduction; 09/16/21: 4/5 flexion, 4+/5 abduction   ? Time 8   ? Period Weeks   ? Status Partially Met   ? Target Date 11/11/21   ? ?  ?  ? ?  ? ? ? Plan - 10/26/21 0758   ? ? Clinical Impression Statement Focused session on strengthening however due to some intermittent pain in R shoulder session performed on mat table. She would benefit from continued PT services in order to address deficits in strength, ROM and pain in order to return to pain free function at home.  ? Personal Factors and Comorbidities Age;Comorbidity 3+   ? Comorbidities DM, HTN, hyperlipidemia, L RTC tear, CVA x 3   ? Examination-Activity Limitations Caring for Others;Lift;Reach Overhead;Dressing   ? Examination-Participation  Restrictions Church;Community Activity;Shop;Laundry;Volunteer   ? Stability/Clinical Decision Making Evolving/Moderate complexity   ? Rehab Potential Fair   ? PT Frequency 2x / week   ? PT Duration 8 weeks   ? PT Treatment/Interventions ADLs/Self Care Home Management;Aquatic Therapy;Biofeedback;Canalith Repostioning;Cryotherapy;Electrical Stimulation;Iontophoresis 44m/ml Dexamethasone;Moist Heat;Traction;Ultrasound;DME Instruction;Gait training;Stair training;Functional mobility training;Therapeutic activities;Therapeutic exercise;Balance training;Neuromuscular re-education;Patient/family education;Manual techniques;Passive range of motion;Dry needling;Vestibular;Joint Manipulations;Spinal Manipulations   ? PT Next Visit Plan Review HEP for R shoulder, continue range of motion, manual techniques, and strengthening   ? PT Home Exercise Plan Access Code: [email or web address](R shoulder) Access Code: [email or web address](balance and weakness from prior episode);   ?  Consulted and Agree with Plan of Care Patient   ? ?  ?  ? ?  ? ? ? ?Lyndel Safe Charlen Bakula PT, DPT, GCS  ?Dawnna Gritz, PT ?10/29/2021, 9:32 AM ? ?   ?

## 2021-11-01 ENCOUNTER — Ambulatory Visit: Payer: Medicare Other | Attending: Neurology

## 2021-11-01 DIAGNOSIS — G8929 Other chronic pain: Secondary | ICD-10-CM | POA: Insufficient documentation

## 2021-11-01 DIAGNOSIS — M25511 Pain in right shoulder: Secondary | ICD-10-CM | POA: Diagnosis present

## 2021-11-01 DIAGNOSIS — M25611 Stiffness of right shoulder, not elsewhere classified: Secondary | ICD-10-CM | POA: Diagnosis present

## 2021-11-01 DIAGNOSIS — M6281 Muscle weakness (generalized): Secondary | ICD-10-CM | POA: Diagnosis present

## 2021-11-01 DIAGNOSIS — R2681 Unsteadiness on feet: Secondary | ICD-10-CM | POA: Diagnosis present

## 2021-11-01 NOTE — Therapy (Addendum)
?OUTPATIENT PHYSICAL THERAPY TREATMENT NOTE ? ? ?Patient Name: Anna Nichols ?MRN: [id number] ?DOB:[birth year], 61 y.o., female ?Today's Date: 11/01/2021 ? ?PCP: Services, [facility] ?REFERRING PROVIDER: Anabel Bene, MD ? ? PT End of Session - 11/01/21 0841   ? ? Visit Number 29   ? Number of Visits 33   ? Date for PT Re-Evaluation 11/11/21   ? Authorization Type eval: 07/21/21   ? Authorization Time Period Medicare primary; medicaid   ? PT Start Time 0845   ? PT Stop Time 0930   ? PT Time Calculation (min) 45 min   ? Activity Tolerance Patient tolerated treatment well   ? Behavior During Therapy [facility] for tasks assessed/performed   ? ?  ?  ? ?  ? ? ?Past Medical History:  ?Diagnosis Date  ? Diabetes mellitus without complication ([place])   ? High cholesterol   ? Hypertension   ? ?History reviewed. No pertinent surgical history. ?There are no problems to display for this patient. ? ? ?REFERRING DIAG: R shoulder pain ? ?THERAPY DIAG:  ?Stiffness of right shoulder, not elsewhere classified ? ?Chronic right shoulder pain ? ?Muscle weakness (generalized) ? ?PERTINENT HISTORY: Pt was in [place] in July 2022, she fell to the right and struck the wall with her RUE. She had immediate pain in her R shoulder. The pain slowly improved on anti-inflammatories however persisted until she eventually went to [facility]. 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 She was previously seeing physical therapy for balance, dizziness, and L sided weakness s/p CVA x 3 however would like to address her R shoulder pain and weakness at this time. She has a return visit with orthopedics on 08/04/21. ? ?PRECAUTIONS: None ? ?SUBJECTIVE: Pt reports that her R shoulder has been doing well but was hurting some more yesterday and today. She complains of 4/10 R shoulder pain upon arrival. Otherwise no changes since last therapy  session. No specific questions currently ? ?PAIN:  ?Are you having pain?: Yes, 4/10 chronic R shoulder pain ? ? ?TODAY'S TREATMENT:  ? ? ?Ther-ex  ?UBE 4 minutes (2 min forward/2 min backward) for warm-up during history (2 minutes unbilled); ?Supine R shoulder serratus punch with 3# dumbbell 2 x 10; ?Supine R shoulder circles with 3# dumbbell 2 x 10 each direction; ?Supine R rhythmic stabilization 2 x 30s; ?L sidelying R shoulder abduction with 3# dumbbell 2 x 10; ?L sidelying R shoulder ER with 3# dumbbell 2 x 10; ?Seated Nautilus lat pull down 40# x 10, 60# 2 x 10;  ?Seated R shoulder overhead press with 3# DB 2 x 10; ? ? ?Manual Therapy  ?Gentle right shoulder passive range of motion into flexion, abduction, and external rotation ?R shoulder GH A/P mobilizations at neutral grade I-II, 30s/bout x 3 bouts ?R shoulder GH A/P mobilizations at 90 abduction and pain-free end range external rotation grade I-II, 30s/bout x 3 bouts ?R shoulder GH inferior mobilizations at 90 abduction grade I-II, 30s/bout x 3 bouts ?R shoulder GH inferior mobilization at end range abduction grade I-II, 30s/bout x 3 ? ? ? ?PATIENT EDUCATION: ?Education details: Pt educated throughout session about proper posture and technique with exercises. Improved exercise technique, movement at target joints, use of target muscles after min to mod verbal, visual, tactile cues.  ?Person educated: Patient ?Education method: Explanation and Demonstration ?Education comprehension: verbalized understanding and returned demonstration ? ? ?  HOME EXERCISE PROGRAM: ?Access Code: [email or web address] (R shoulder) Access Code: [email or web address] (balance and weakness from prior episode);  ? ? ? ? ? PT Short Term Goals - 09/16/21 1345   ? ?  ? PT SHORT TERM GOAL #1  ? Title Pt will be independent with HEP to improve strength, range of motion, and pain in order to decrease pain and improve function at home.   ? Time 4   ? Period Weeks   ? Status On-going   ? Target Date 10/14/21   ? ?  ?   ? ?  ? ? ? PT Long Term Goals - 09/16/21 1345   ? ?  ? PT LONG TERM GOAL #1  ? Title Pt will report worst R shoulder pain as no more than 4/10 in order to demonstrate clinically significant improvement in shoulder pain.   ? Baseline 07/21/21: worst: 7/10; 08/24/21: 8/10 (short duration, pain subsides quicker);   ? Time 8   ? Period Weeks   ? Status Deferred   ? Target Date 11/11/21   ?  ? PT LONG TERM GOAL #2  ? Title Pt will improve R shoulder FOTO to at least 60 in order to demonstrate improvement in function related to her R shoulder   ? Baseline 07/21/21: 43 08/24/21: 68; 09/16/21: 54   ? Time 8   ? Period Weeks   ? Status Partially Met   ? Target Date 11/11/21   ?  ? PT LONG TERM GOAL #3  ? Title Pt will decrease quick DASH score by at least 8% in order to demonstrate clinically significant reduction in disability.   ? Baseline 07/21/21: 70.5% 08/24/21: 13.6%; 09/16/21: 30%   ? Time 8   ? Period Weeks   ? Status Achieved   ?  ? PT LONG TERM GOAL #4  ? Title Pt will increase pain-free strength of R shoulder flexion and abduction to at least 4+/5 in order to demonstrate improvement in strength and function when using R shoulder   ? Baseline 07/21/21: 3+/5 for both flexion and abduction; 08/24/21: 4/5 for both flexion and abduction; 09/16/21: 4/5 flexion, 4+/5 abduction   ? Time 8   ? Period Weeks   ? Status Partially Met   ? Target Date 11/11/21   ? ?  ?  ? ?  ? ? ? Plan - 10/26/21 0758   ? ? Clinical Impression Statement Focused session on strengthening again today and pt is able to return to using the Nautilus machine and performing seated exercises. No significant increase in pain noted throughout session. Will continue to progress strengthening in future sessions especially in upright positions for improvement in function. She would benefit from continued PT services in order to address deficits in strength, ROM and pain in order to return to pain free function at home.  ? Personal Factors and Comorbidities  Age;Comorbidity 3+   ? Comorbidities DM, HTN, hyperlipidemia, L RTC tear, CVA x 3   ? Examination-Activity Limitations Caring for Others;Lift;Reach Overhead;Dressing   ? Examination-Participation Restrictions Church;Community Activity;Shop;Laundry;Volunteer   ? Stability/Clinical Decision Making Evolving/Moderate complexity   ? Rehab Potential Fair   ? PT Frequency 2x / week   ? PT Duration 8 weeks   ? PT Treatment/Interventions ADLs/Self Care Home Management;Aquatic Therapy;Biofeedback;Canalith Repostioning;Cryotherapy;Electrical Stimulation;Iontophoresis 82m/ml Dexamethasone;Moist Heat;Traction;Ultrasound;DME Instruction;Gait training;Stair training;Functional mobility training;Therapeutic activities;Therapeutic exercise;Balance training;Neuromuscular re-education;Patient/family education;Manual techniques;Passive range of motion;Dry needling;Vestibular;Joint Manipulations;Spinal Manipulations   ? PT Next Visit Plan Review HEP for R shoulder, continue range  of motion, manual techniques, and strengthening   ? PT Home Exercise Plan Access Code: [email or web address] (R shoulder) Access Code: [email or web address] (balance and weakness from prior episode);   ? Consulted and Agree with Plan of Care Patient   ? ?  ?  ? ?  ? ? ? ?Lyndel Safe Mylin Hirano PT, DPT, GCS  ?Akaylah Lalley, PT ?11/01/2021, 8:42 AM ? ?   ?

## 2021-11-03 NOTE — Therapy (Signed)
?OUTPATIENT PHYSICAL THERAPY TREATMENT NOTE/PROGRESS NOTE/DISCHARGE ? ?Dates of reporting period  09/27/21   to   11/04/21  ? ? ?Patient Name: Anna Nichols ?MRN: [id number] ?DOB:[birth year], 61 y.o., female ?Today's Date: 11/05/2021 ? ?PCP: Services, [facility] ?REFERRING PROVIDER: Anabel Bene, MD ? ? PT End of Session - 11/04/21 1340   ? ? Visit Number 30   ? Number of Visits 33   ? Date for PT Re-Evaluation 11/11/21   ? Authorization Type eval: 07/21/21   ? Authorization Time Period Medicare primary; medicaid   ? PT Start Time 1517   ? PT Stop Time 6160   ? PT Time Calculation (min) 42 min   ? Activity Tolerance Patient tolerated treatment well   ? Behavior During Therapy [facility] for tasks assessed/performed   ? ?  ?  ? ?  ? ? ? ?Past Medical History:  ?Diagnosis Date  ? Diabetes mellitus without complication ([place])   ? High cholesterol   ? Hypertension   ? ?History reviewed. No pertinent surgical history. ?There are no problems to display for this patient. ? ? ?REFERRING DIAG: R shoulder pain ? ?THERAPY DIAG:  ?Stiffness of right shoulder, not elsewhere classified ? ?Chronic right shoulder pain ? ?Muscle weakness (generalized) ? ?PERTINENT HISTORY: Pt was in [place] in July 2022, she fell to the right and struck the wall with her RUE. She had immediate pain in her R shoulder. The pain slowly improved on anti-inflammatories however persisted until she eventually went to [facility]. 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 She was previously seeing physical therapy for balance, dizziness, and L sided weakness s/p CVA x 3 however would like to address her R shoulder pain and weakness at this time. She has a return visit with orthopedics on 08/04/21. ? ?PRECAUTIONS: None ? ? ? ?TODAY'S TREATMENT:  ? ?SUBJECTIVE: Pt reports that her R shoulder has been doing well and denies any resting pain upon  arrival. She continues to note improvement in strength and pain. She continues to have intermittent pain but it is less frequent, less intense, and has a shorter duration. No specific questions currently ? ?PAIN:  ?Are you having pain?: No ? ? ?Ther-ex  ?UBE 4 minutes (2 min forward/2 min backward) for warm-up during history (2 minutes unbilled); ?Updated outcome measures with patient during session: ?FOTO: 60 ?QuickDASH: 40.9% ?Worst pain: 7/10 (short duration, pain subsides quicker and occurs less often );  ? ?Supine R shoulder serratus punch with 4# dumbbell 2 x 10; ?Supine R shoulder circles with 4# dumbbell 2 x 10 each direction; ?L sidelying R shoulder ER with 3# dumbbell 2 x 10; ?Seated R shoulder flexion with 3# dumbbell from neutral to 120 degrees of flexion 2 x 10; ?Seated R shoulder abduction with 3# dumbbell from neutral to 120 degrees of flexion 2 x 10; ?Seated R shoulder overhead press with 3# DB x 10; ?Updated HEP provided to patient; ? ? ?Manual Therapy  ?Gentle right shoulder passive range of motion into flexion, abduction, and external rotation ?R shoulder GH A/P mobilizations at neutral grade I-II, 30s/bout x 3 bouts ?R shoulder GH A/P mobilizations at 90 abduction and pain-free end range external rotation grade I-II, 30s/bout x 3 bouts ?R shoulder GH inferior mobilizations at 90 abduction grade I-II, 30s/bout x 3 bouts ?R shoulder GH inferior mobilization at end range abduction grade I-II, 30s/bout x 3 ? ? ?  PATIENT EDUCATION: ?Education details: Pt educated throughout session about proper posture and technique with exercises. Improved exercise technique, movement at target joints, use of target muscles after min to mod verbal, visual, tactile cues. Education provided regarding discharge, updated HEP; ?Person educated: Patient ?Education method: Customer service manager and Handout ?Education comprehension: verbalized understanding and returned demonstration ? ? ?HOME EXERCISE  PROGRAM: ?Access Code: [email or web address] ?URL: [email or web address] ?Date: 11/05/2021 ?Prepared by: Roxana Hires ? ?Exercises ?- Standing Single Arm Shoulder Flexion with Resistance (Mirrored)  - 1 x daily - 7 x weekly - 2 sets - 10 reps - 3s hold ?- Standing Single Arm Shoulder Abduction with Resistance (Mirrored)  - 1 x daily - 7 x weekly - 2 sets - 10 reps - 3s hold ?- Seated Shoulder Row with Anchored Resistance  - 1 x daily - 7 x weekly - 2 sets - 10 reps - 3s hold ?- Dynamic Hug with Resistance  - 1 x daily - 7 x weekly - 2 sets - 10 reps - 3s hold ?- Standing Shoulder Internal Rotation with Anchored Resistance (Mirrored)  - 1 x daily - 7 x weekly - 2 sets - 10 reps - 3s hold ?- Shoulder External Rotation with Anchored Resistance with Towel Under Elbow (Mirrored)  - 1 x daily - 7 x weekly - 2 sets - 10 reps - 3s hold ? ?Access Code: [email or web address] (balance and weakness from prior episode);  ? ? ? ? ? ? PT Short Term Goals - 09/16/21 1345   ? ?  ? PT SHORT TERM GOAL #1  ? Title Pt will be independent with HEP to improve strength, range of motion, and pain in order to decrease pain and improve function at home.   ? Time 4   ? Period Weeks   ? Status Achieved  ? Target Date   ? ?  ?  ? ?  ? ? ? PT Long Term Goals - 09/16/21 1345   ? ?  ? PT LONG TERM GOAL #1  ? Title Pt will report worst R shoulder pain as no more than 4/10 in order to demonstrate clinically significant improvement in shoulder pain.   ? Baseline 07/21/21: worst: 7/10; 08/24/21: 8/10 (short duration, pain subsides quicker); 11/04/21: 7/10 (short duration, pain subsides quicker);   ? Time 8   ? Period Weeks   ? Status  Partially met  ? Target Date 11/11/21   ?  ? PT LONG TERM GOAL #2  ? Title Pt will improve R shoulder FOTO to at least 60 in order to demonstrate improvement in function related to her R shoulder   ? Baseline 07/21/21: 43 08/24/21: 68; 09/16/21: 54 ; 11/04/21: 60  ? Time 8   ? Period Weeks   ? Status Achieved  ? Target Date 11/11/21   ?  ?  PT LONG TERM GOAL #3  ? Title Pt will decrease quick DASH score by at least 8% in order to demonstrate clinically significant reduction in disability.   ? Baseline 07/21/21: 70.5% 08/24/21: 13.6%; 09/16/21: 30%; 11/04/21: 40.9%   ? Time 8   ? Period Weeks   ? Status Achieved   ?  ? PT LONG TERM GOAL #4  ? Title Pt will increase pain-free strength of R shoulder flexion and abduction to at least 4+/5 in order to demonstrate improvement in strength and function when using R shoulder   ? Baseline 07/21/21: 3+/5 for both flexion and abduction; 08/24/21: 4/5 for both flexion and  abduction; 09/16/21: 4/5 flexion, 4+/5 abduction; 11/04/21: 4+/5 flexion , 4+/5 abduction  ? Time 8   ? Period Weeks   ? Status Achieved  ? Target Date   ? ?  ?  ? ?  ? ? ? Plan - 10/26/21 0758   ? ? Clinical Impression Statement Updated outcome measures and goals with patient during session today. Her R shoulder flexion and abduction strength has improved from 3+/5 at initial evaluation to to 4+/5 today. Her FOTO scored improved to 43 at initial evaluation to 60 today. Her QuickDASH from 70.5% at initial evaluation to 40.9% today. Her worst R shoulder pain still increases to 7/10 but it occurs less frequently, is less intense, and has a shorter duration. Overall pt has made excellent progress with respect to her R shoulder and has met almost all of her goals. She will be discharged on this date with updated HEP and education regarding follow-up if symptoms recur or worsen.  ? Personal Factors and Comorbidities Age;Comorbidity 3+   ? Comorbidities DM, HTN, hyperlipidemia, L RTC tear, CVA x 3   ? Examination-Activity Limitations Caring for Others;Lift;Reach Overhead;Dressing   ? Examination-Participation Restrictions Church;Community Activity;Shop;Laundry;Volunteer   ? Stability/Clinical Decision Making Evolving/Moderate complexity   ? Rehab Potential Fair   ? PT Frequency 2x / week   ? PT Duration 8 weeks   ? PT Treatment/Interventions ADLs/Self Care Home  Management;Aquatic Therapy;Biofeedback;Canalith Repostioning;Cryotherapy;Electrical Stimulation;Iontophoresis 60m/ml Dexamethasone;Moist Heat;Traction;Ultrasound;DME Instruction;Gait training;Stair training;Functional

## 2021-11-04 ENCOUNTER — Ambulatory Visit: Payer: Medicare Other

## 2021-11-04 DIAGNOSIS — M25611 Stiffness of right shoulder, not elsewhere classified: Secondary | ICD-10-CM

## 2021-11-04 DIAGNOSIS — M6281 Muscle weakness (generalized): Secondary | ICD-10-CM

## 2021-11-04 DIAGNOSIS — G8929 Other chronic pain: Secondary | ICD-10-CM

## 2021-11-08 ENCOUNTER — Ambulatory Visit: Payer: Medicare Other

## 2021-11-08 DIAGNOSIS — R2681 Unsteadiness on feet: Secondary | ICD-10-CM

## 2021-11-08 NOTE — Therapy (Signed)
[place] ?[facility] ?[address]. [place], [place], [address] ?Phone: [phone]   Fax:  [phone] ? ?Physical Therapy Screen ?Patient Details  ?Name: Anna Nichols ?MRN: [id number] ?Date of Birth: 03/23/1961 ?No data recorded ? ?Encounter Date: 11/08/2021 ? ? PT End of Session - 11/08/21 1320   ? ? Visit Number 1   ? Number of Visits 1   ? Authorization Type screen 11/08/21   ? PT Start Time 3875   ? PT Stop Time 1400   ? PT Time Calculation (min) 43 min   ? Activity Tolerance Patient tolerated treatment well   ? Behavior During Therapy [facility] for tasks assessed/performed   ? ?  ?  ? ?  ? ? ?Past Medical History:  ?Diagnosis Date  ? Diabetes mellitus without complication ([place])   ? High cholesterol   ? Hypertension   ? ? ?History reviewed. No pertinent surgical history. ? ?There were no vitals filed for this visit. ? ? ? ?Retesting performed with patient to assess needs for additional physical therapy: ? ?ABC: 49.4% ?5TSTS: 11.8s ?TUG: 10.8s ?2MWT:  ?pre vitals: BP: 118/62, HR: 84, SpO2: 99% ?Distance walked: 43' without assistive device  ?post vitals: BP: 122/52, HR: 91, SpO2: 98% ?BERG: 55/56 ? ? ?Previous testing (see goal section below): ? ? ? PT Long Term Goals - 06/01/21 1153   ?  ?    ?     ?  PT LONG TERM GOAL #1  ?  Title Pt will improve ABC by at least 13% in order to demonstrate clinically significant improvement in balance confidence.   ?  Baseline 07/17/20: 63.75%; 08/28/20: 60.625%; 10/01/20: 71.25%; 11/19/20: 75%; 02/02/21: 75.9% 03/02/21: 71.875%; 05/04/21: 70%; 06/01/21: 71.9%  ?  Time 8   ?  Period Weeks   ?  Status Partially Met   ?  Target Date 08/24/21   ?     ?  PT LONG TERM GOAL #2  ?  Title Pt will improve FOTO to at least 71 in order to demonstrate improvement in function   ?  Baseline 07/17/20: 70; 08/28/20: 60; 10/01/20: 64; 11/19/20: 60; 02/02/21: 64; 03/02/21: 51; 05/04/21: 59; 06/01/21: 64   ?  Time 12   ?  Period Weeks   ?  Status On-going   ?  Target Date 08/24/21    ?     ?  PT LONG TERM GOAL #3  ?  Title Pt will improve 5TSTS to below 12s to demonstrate improvement in BLE strength;   ?  Baseline 07/17/20: to perform at next visit; 07/21/20: 14.1s; 08/28/20: 12.5s; 10/01/20: 11.80s; 11/19/20: 10.5s; 02/02/21: 10.0s; 03/02/21: 10.4s; 05/04/21: 11.3; 06/01/21: 9.9s;   ?  Time 12   ?  Period Weeks   ?  Status Achieved   ?     ?  PT LONG TERM GOAL #4  ?  Title Pt will increase 2MWT by at least 53f in order to demonstrate clinically significant improvement in cardiopulmonary endurance and community ambulation   ?  Baseline 07/17/20: To be performed at next visit; 07/21/20: 349'; 08/28/20: 386'; 10/01/20: 371'; 11/19/20: 430'; 02/02/21: 380'; 03/02/21: 404'; 05/04/21: 360; 06/01/21: 380';  ?  Time 8   ?  Period Weeks   ?  Status Partially Met   ?  Target Date 08/24/21   ?     ?  ?   ?  ?  ?   ?  ?  ? ?  ?  Plan - 06/29/21 1216   ?  ?  Clinical Impression Statement Performed screening with patient during visit today to assess whether she had additional needs for therapy. Her balance/strength outcome measures remained relatively unchanged since they were last updated in December 2022. The main outcome measure that changed was a significant drop in patient's balance confidence. Otherwise pt demonstrates good balance and stability and does not need additional physical therapy at this time. Pt reports continued feeling of sensory over stimulation as well as occasional dizziness. Encouraged pt to follow-up with Dr. Melrose Nakayama, neurologist, regarding this issue.   ?  Personal Factors and Comorbidities Age;Comorbidity 3+   ?  Comorbidities DM, HTN, hyperlipidemia, L RTC tear, multiple CVA   ?  PT Home Exercise Plan Access Code: [email or web address]   ?  Consulted and Agree with Plan of Care Patient   ?  ?   ?  ?  ?   ?  ? ? [facility] PT Assessment - 11/08/21 1332   ? ?  ? Standardized Balance Assessment  ? Standardized Balance Assessment Berg Balance Test   ?  ? Berg Balance Test  ? Sit to Stand Able to stand without using hands  and stabilize independently   ? Standing Unsupported Able to stand safely 2 minutes   ? Sitting with Back Unsupported but Feet Supported on Floor or Stool Able to sit safely and securely 2 minutes   ? Stand to Sit Sits safely with minimal use of hands   ? Transfers Able to transfer safely, minor use of hands   ? Standing Unsupported with Eyes Closed Able to stand 10 seconds safely   ? Standing Unsupported with Feet Together Able to place feet together independently and stand 1 minute safely   ? From Standing, Reach Forward with Outstretched Arm Can reach forward >12 cm safely (5")   ? From Standing Position, Pick up Object from [place] to pick up shoe safely and easily   ? From Standing Position, Turn to Look Behind Over each Shoulder Looks behind from both sides and weight shifts well   ? Turn 360 Degrees Able to turn 360 degrees safely in 4 seconds or less   ? Standing Unsupported, Alternately Place Feet on Step/Stool Able to stand independently and safely and complete 8 steps in 20 seconds   ? Standing Unsupported, One Foot in [place] to place foot tandem independently and hold 30 seconds   ? Standing on One Leg Able to lift leg independently and hold > 10 seconds   ? Total Score 55   ? ?  ?  ? ?  ? ? ? ? ? ? ? ? ? ? ? ?Patient will benefit from skilled therapeutic intervention in order to improve the following deficits and impairments:    ? ?Visit Diagnosis: ?Unsteadiness on feet ? ? ? ? ?Problem List ?There are no problems to display for this patient. ? ?Lyndel Safe Adrienne Delay PT, DPT, GCS  ?Zlaty Alexa, PT ?11/08/2021, 2:25 PM ? ?[place] ?[facility] ?[address] [place], [place], [address] ?Phone: [phone]   Fax:  [phone] ? ?Name: Anna Nichols ?MRN: [id number] ?Date of Birth: Feb 13, 1961 ? ? ?

## 2022-01-19 ENCOUNTER — Ambulatory Visit: Payer: Medicare HMO | Attending: Neurology

## 2022-01-19 DIAGNOSIS — R2681 Unsteadiness on feet: Secondary | ICD-10-CM | POA: Diagnosis present

## 2022-01-19 DIAGNOSIS — M6281 Muscle weakness (generalized): Secondary | ICD-10-CM | POA: Insufficient documentation
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
## 2022-01-28 ENCOUNTER — Ambulatory Visit: Payer: Medicare HMO

## 2022-01-28 DIAGNOSIS — M6281 Muscle weakness (generalized): Secondary | ICD-10-CM

## 2022-01-28 DIAGNOSIS — R2681 Unsteadiness on feet: Secondary | ICD-10-CM
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
## 2022-02-07 ENCOUNTER — Ambulatory Visit: Payer: Medicare HMO | Attending: Neurology

## 2022-02-07 DIAGNOSIS — R2681 Unsteadiness on feet: Secondary | ICD-10-CM | POA: Diagnosis present

## 2022-02-07 DIAGNOSIS — M6281 Muscle weakness (generalized): Secondary | ICD-10-CM | POA: Diagnosis not present
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
## 2022-02-16 ENCOUNTER — Ambulatory Visit: Payer: Medicare HMO

## 2022-02-16 DIAGNOSIS — R2681 Unsteadiness on feet: Secondary | ICD-10-CM

## 2022-02-16 DIAGNOSIS — M6281 Muscle weakness (generalized): Secondary | ICD-10-CM

## 2022-02-23 ENCOUNTER — Ambulatory Visit: Payer: Medicare HMO

## 2022-02-23 DIAGNOSIS — M6281 Muscle weakness (generalized): Secondary | ICD-10-CM

## 2022-02-23 DIAGNOSIS — R2681 Unsteadiness on feet: Secondary | ICD-10-CM
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
## 2022-02-28 ENCOUNTER — Ambulatory Visit: Payer: Medicare HMO

## 2022-02-28 DIAGNOSIS — R2681 Unsteadiness on feet: Secondary | ICD-10-CM

## 2022-02-28 DIAGNOSIS — M6281 Muscle weakness (generalized): Secondary | ICD-10-CM

## 2022-03-07 ENCOUNTER — Ambulatory Visit: Payer: Medicare HMO | Attending: Neurology

## 2022-03-07 DIAGNOSIS — M6281 Muscle weakness (generalized): Secondary | ICD-10-CM | POA: Insufficient documentation

## 2022-03-07 DIAGNOSIS — R2681 Unsteadiness on feet: Secondary | ICD-10-CM | POA: Insufficient documentation
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
## 2022-03-09 ENCOUNTER — Ambulatory Visit: Payer: Medicare HMO

## 2022-03-09 DIAGNOSIS — M6281 Muscle weakness (generalized): Secondary | ICD-10-CM | POA: Diagnosis present

## 2022-03-09 DIAGNOSIS — R2681 Unsteadiness on feet: Secondary | ICD-10-CM | POA: Diagnosis present

## 2022-03-21 ENCOUNTER — Ambulatory Visit: Payer: Medicare HMO

## 2022-03-21 DIAGNOSIS — R2681 Unsteadiness on feet: Secondary | ICD-10-CM

## 2022-03-21 DIAGNOSIS — M6281 Muscle weakness (generalized): Secondary | ICD-10-CM
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
## 2022-03-28 ENCOUNTER — Ambulatory Visit: Payer: Medicare HMO

## 2022-03-28 DIAGNOSIS — M6281 Muscle weakness (generalized): Secondary | ICD-10-CM

## 2022-03-28 DIAGNOSIS — R2681 Unsteadiness on feet: Secondary | ICD-10-CM
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
## 2022-04-05 ENCOUNTER — Ambulatory Visit: Payer: Medicare HMO

## 2022-04-06 ENCOUNTER — Ambulatory Visit: Payer: Medicare HMO | Attending: Neurology

## 2022-04-06 DIAGNOSIS — R2681 Unsteadiness on feet: Secondary | ICD-10-CM | POA: Diagnosis present

## 2022-04-06 DIAGNOSIS — R262 Difficulty in walking, not elsewhere classified: Secondary | ICD-10-CM | POA: Diagnosis present

## 2022-04-06 DIAGNOSIS — M6281 Muscle weakness (generalized): Secondary | ICD-10-CM | POA: Insufficient documentation
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
## 2022-04-11 ENCOUNTER — Ambulatory Visit: Payer: Medicare HMO

## 2022-04-11 DIAGNOSIS — M6281 Muscle weakness (generalized): Secondary | ICD-10-CM

## 2022-04-11 DIAGNOSIS — R2681 Unsteadiness on feet: Secondary | ICD-10-CM

## 2022-04-14 ENCOUNTER — Ambulatory Visit: Payer: Medicare HMO | Admitting: Physical Therapy

## 2022-04-14 ENCOUNTER — Encounter: Payer: Self-pay | Admitting: Physical Therapy

## 2022-04-14 DIAGNOSIS — R262 Difficulty in walking, not elsewhere classified: Secondary | ICD-10-CM

## 2022-04-14 DIAGNOSIS — M6281 Muscle weakness (generalized): Secondary | ICD-10-CM

## 2022-04-14 DIAGNOSIS — R2681 Unsteadiness on feet: Secondary | ICD-10-CM
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
## 2022-04-25 ENCOUNTER — Ambulatory Visit: Payer: Medicare HMO

## 2022-04-25 DIAGNOSIS — M6281 Muscle weakness (generalized): Secondary | ICD-10-CM | POA: Diagnosis not present

## 2022-04-25 DIAGNOSIS — R262 Difficulty in walking, not elsewhere classified: Secondary | ICD-10-CM

## 2022-04-25 DIAGNOSIS — R2681 Unsteadiness on feet: Secondary | ICD-10-CM

## 2022-05-03 ENCOUNTER — Ambulatory Visit: Payer: Medicare HMO | Attending: Neurology

## 2022-05-03 DIAGNOSIS — R2681 Unsteadiness on feet: Secondary | ICD-10-CM | POA: Diagnosis present

## 2022-05-03 DIAGNOSIS — M6281 Muscle weakness (generalized): Secondary | ICD-10-CM | POA: Diagnosis not present
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
## 2022-05-13 ENCOUNTER — Ambulatory Visit: Payer: Medicare HMO

## 2022-05-13 DIAGNOSIS — M6281 Muscle weakness (generalized): Secondary | ICD-10-CM

## 2022-05-13 DIAGNOSIS — R2681 Unsteadiness on feet: Secondary | ICD-10-CM
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
## 2022-05-16 ENCOUNTER — Ambulatory Visit: Payer: Medicare HMO

## 2022-05-16 DIAGNOSIS — M6281 Muscle weakness (generalized): Secondary | ICD-10-CM

## 2022-05-16 DIAGNOSIS — R2681 Unsteadiness on feet: Secondary | ICD-10-CM
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
## 2022-06-06 ENCOUNTER — Ambulatory Visit: Payer: Medicare HMO | Attending: Neurology

## 2022-06-06 DIAGNOSIS — M6281 Muscle weakness (generalized): Secondary | ICD-10-CM | POA: Insufficient documentation

## 2022-06-06 DIAGNOSIS — R2681 Unsteadiness on feet: Secondary | ICD-10-CM | POA: Insufficient documentation

## 2022-06-13 ENCOUNTER — Ambulatory Visit: Payer: Medicare HMO

## 2022-06-13 DIAGNOSIS — M6281 Muscle weakness (generalized): Secondary | ICD-10-CM | POA: Diagnosis not present

## 2022-06-13 DIAGNOSIS — R2681 Unsteadiness on feet: Secondary | ICD-10-CM
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
## 2022-06-27 ENCOUNTER — Ambulatory Visit: Payer: Medicare HMO

## 2022-06-27 DIAGNOSIS — R2681 Unsteadiness on feet: Secondary | ICD-10-CM

## 2022-06-27 DIAGNOSIS — M6281 Muscle weakness (generalized): Secondary | ICD-10-CM | POA: Diagnosis not present

## 2022-07-05 ENCOUNTER — Ambulatory Visit: Payer: Medicare HMO | Attending: Neurology

## 2022-07-05 DIAGNOSIS — R2681 Unsteadiness on feet: Secondary | ICD-10-CM | POA: Insufficient documentation

## 2022-07-05 DIAGNOSIS — M6281 Muscle weakness (generalized): Secondary | ICD-10-CM | POA: Diagnosis present
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
## 2022-07-12 ENCOUNTER — Ambulatory Visit: Payer: Medicare HMO

## 2022-07-12 DIAGNOSIS — M6281 Muscle weakness (generalized): Secondary | ICD-10-CM

## 2022-07-12 DIAGNOSIS — R2681 Unsteadiness on feet: Secondary | ICD-10-CM
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
## 2022-07-18 ENCOUNTER — Ambulatory Visit: Payer: Medicare HMO

## 2022-07-18 DIAGNOSIS — M6281 Muscle weakness (generalized): Secondary | ICD-10-CM

## 2022-07-18 DIAGNOSIS — R2681 Unsteadiness on feet: Secondary | ICD-10-CM
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
## 2022-07-26 ENCOUNTER — Ambulatory Visit: Payer: Medicare HMO

## 2022-07-26 DIAGNOSIS — R2681 Unsteadiness on feet: Secondary | ICD-10-CM

## 2022-07-26 DIAGNOSIS — M6281 Muscle weakness (generalized): Secondary | ICD-10-CM | POA: Diagnosis not present

## 2022-08-04 ENCOUNTER — Ambulatory Visit: Payer: Medicare HMO | Attending: Neurology

## 2022-08-04 DIAGNOSIS — M6281 Muscle weakness (generalized): Secondary | ICD-10-CM | POA: Diagnosis not present

## 2022-08-04 DIAGNOSIS — R2681 Unsteadiness on feet: Secondary | ICD-10-CM | POA: Diagnosis present
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
## 2022-08-08 ENCOUNTER — Ambulatory Visit: Payer: Medicare HMO

## 2022-08-08 DIAGNOSIS — M6281 Muscle weakness (generalized): Secondary | ICD-10-CM | POA: Diagnosis not present

## 2022-08-08 DIAGNOSIS — R2681 Unsteadiness on feet: Secondary | ICD-10-CM
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
## 2022-08-23 ENCOUNTER — Ambulatory Visit: Payer: Medicare HMO

## 2022-08-23 DIAGNOSIS — M6281 Muscle weakness (generalized): Secondary | ICD-10-CM

## 2022-08-23 DIAGNOSIS — R2681 Unsteadiness on feet: Secondary | ICD-10-CM
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
## 2022-08-30 ENCOUNTER — Ambulatory Visit: Payer: Medicare HMO

## 2022-08-30 DIAGNOSIS — R2681 Unsteadiness on feet: Secondary | ICD-10-CM

## 2022-08-30 DIAGNOSIS — M6281 Muscle weakness (generalized): Secondary | ICD-10-CM

## 2022-09-06 ENCOUNTER — Ambulatory Visit: Payer: Medicare HMO | Attending: Neurology

## 2022-09-06 DIAGNOSIS — M6281 Muscle weakness (generalized): Secondary | ICD-10-CM | POA: Diagnosis present

## 2022-09-06 DIAGNOSIS — R262 Difficulty in walking, not elsewhere classified: Secondary | ICD-10-CM | POA: Diagnosis present

## 2022-09-06 DIAGNOSIS — R2681 Unsteadiness on feet: Secondary | ICD-10-CM | POA: Diagnosis present
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
## 2022-09-12 ENCOUNTER — Ambulatory Visit: Payer: Medicare HMO

## 2022-09-12 DIAGNOSIS — M6281 Muscle weakness (generalized): Secondary | ICD-10-CM

## 2022-09-12 DIAGNOSIS — R2681 Unsteadiness on feet: Secondary | ICD-10-CM

## 2022-09-16 ENCOUNTER — Ambulatory Visit: Payer: Medicare HMO

## 2022-09-16 DIAGNOSIS — R2681 Unsteadiness on feet: Secondary | ICD-10-CM

## 2022-09-16 DIAGNOSIS — M6281 Muscle weakness (generalized): Secondary | ICD-10-CM | POA: Diagnosis not present
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
## 2022-09-20 ENCOUNTER — Ambulatory Visit: Payer: Medicare HMO

## 2022-09-20 DIAGNOSIS — M6281 Muscle weakness (generalized): Secondary | ICD-10-CM | POA: Diagnosis not present

## 2022-09-20 DIAGNOSIS — R2681 Unsteadiness on feet: Secondary | ICD-10-CM
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
## 2022-09-26 ENCOUNTER — Ambulatory Visit: Payer: Medicare HMO

## 2022-09-26 DIAGNOSIS — R2681 Unsteadiness on feet: Secondary | ICD-10-CM

## 2022-09-26 DIAGNOSIS — M6281 Muscle weakness (generalized): Secondary | ICD-10-CM | POA: Diagnosis not present
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
## 2022-10-04 ENCOUNTER — Ambulatory Visit: Payer: Medicare HMO

## 2022-10-04 DIAGNOSIS — M6281 Muscle weakness (generalized): Secondary | ICD-10-CM

## 2022-10-04 DIAGNOSIS — R2681 Unsteadiness on feet: Secondary | ICD-10-CM

## 2022-10-05 ENCOUNTER — Ambulatory Visit: Payer: Medicare HMO | Attending: Neurology

## 2022-10-05 DIAGNOSIS — R262 Difficulty in walking, not elsewhere classified: Secondary | ICD-10-CM | POA: Insufficient documentation

## 2022-10-05 DIAGNOSIS — M6281 Muscle weakness (generalized): Secondary | ICD-10-CM | POA: Insufficient documentation

## 2022-10-05 DIAGNOSIS — R2681 Unsteadiness on feet: Secondary | ICD-10-CM | POA: Diagnosis present
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
## 2022-10-11 ENCOUNTER — Ambulatory Visit: Payer: Medicare HMO

## 2022-10-11 DIAGNOSIS — M6281 Muscle weakness (generalized): Secondary | ICD-10-CM

## 2022-10-11 DIAGNOSIS — R2681 Unsteadiness on feet: Secondary | ICD-10-CM
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
## 2022-10-18 ENCOUNTER — Ambulatory Visit: Payer: Medicare HMO

## 2022-10-18 DIAGNOSIS — M6281 Muscle weakness (generalized): Secondary | ICD-10-CM | POA: Diagnosis not present

## 2022-10-18 DIAGNOSIS — R2681 Unsteadiness on feet: Secondary | ICD-10-CM
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
## 2022-10-25 ENCOUNTER — Ambulatory Visit: Payer: Medicare HMO

## 2022-10-25 DIAGNOSIS — R262 Difficulty in walking, not elsewhere classified: Secondary | ICD-10-CM

## 2022-10-25 DIAGNOSIS — M6281 Muscle weakness (generalized): Secondary | ICD-10-CM

## 2022-10-25 DIAGNOSIS — R2681 Unsteadiness on feet: Secondary | ICD-10-CM

## 2022-11-02 ENCOUNTER — Ambulatory Visit: Payer: Medicare HMO | Attending: Neurology

## 2022-11-02 DIAGNOSIS — M6281 Muscle weakness (generalized): Secondary | ICD-10-CM | POA: Diagnosis present

## 2022-11-02 DIAGNOSIS — R262 Difficulty in walking, not elsewhere classified: Secondary | ICD-10-CM | POA: Insufficient documentation

## 2022-11-02 DIAGNOSIS — R2681 Unsteadiness on feet: Secondary | ICD-10-CM | POA: Insufficient documentation
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
## 2022-11-11 ENCOUNTER — Ambulatory Visit: Payer: Medicare HMO

## 2022-11-11 DIAGNOSIS — R2681 Unsteadiness on feet: Secondary | ICD-10-CM

## 2022-11-11 DIAGNOSIS — M6281 Muscle weakness (generalized): Secondary | ICD-10-CM

## 2022-11-14 ENCOUNTER — Ambulatory Visit: Payer: Medicare HMO
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
## 2022-11-18 ENCOUNTER — Ambulatory Visit: Payer: Medicare HMO

## 2022-11-18 DIAGNOSIS — M6281 Muscle weakness (generalized): Secondary | ICD-10-CM

## 2022-11-18 DIAGNOSIS — R262 Difficulty in walking, not elsewhere classified: Secondary | ICD-10-CM

## 2022-11-18 DIAGNOSIS — R2681 Unsteadiness on feet: Secondary | ICD-10-CM

## 2022-11-22 ENCOUNTER — Ambulatory Visit: Payer: Medicare HMO

## 2022-11-22 DIAGNOSIS — M6281 Muscle weakness (generalized): Secondary | ICD-10-CM

## 2022-11-22 DIAGNOSIS — R2681 Unsteadiness on feet: Secondary | ICD-10-CM
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
## 2022-11-29 ENCOUNTER — Ambulatory Visit: Payer: Medicare HMO

## 2022-11-29 DIAGNOSIS — R2681 Unsteadiness on feet: Secondary | ICD-10-CM

## 2022-11-29 DIAGNOSIS — M6281 Muscle weakness (generalized): Secondary | ICD-10-CM

## 2022-11-29 DIAGNOSIS — R262 Difficulty in walking, not elsewhere classified: Secondary | ICD-10-CM
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
## 2022-12-01 ENCOUNTER — Ambulatory Visit: Payer: Medicare HMO

## 2022-12-05 ENCOUNTER — Ambulatory Visit: Payer: Medicare HMO

## 2022-12-07 ENCOUNTER — Ambulatory Visit: Payer: Medicare HMO | Attending: Neurology

## 2022-12-07 DIAGNOSIS — R2681 Unsteadiness on feet: Secondary | ICD-10-CM | POA: Insufficient documentation

## 2022-12-07 DIAGNOSIS — M6281 Muscle weakness (generalized): Secondary | ICD-10-CM | POA: Diagnosis present
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
## 2022-12-13 ENCOUNTER — Ambulatory Visit: Payer: Medicare HMO

## 2022-12-19 ENCOUNTER — Ambulatory Visit: Payer: Medicare HMO

## 2022-12-19 DIAGNOSIS — M6281 Muscle weakness (generalized): Secondary | ICD-10-CM

## 2022-12-19 DIAGNOSIS — R2681 Unsteadiness on feet: Secondary | ICD-10-CM
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
## 2022-12-23 ENCOUNTER — Ambulatory Visit: Payer: Medicare HMO

## 2022-12-23 DIAGNOSIS — M6281 Muscle weakness (generalized): Secondary | ICD-10-CM

## 2022-12-23 DIAGNOSIS — R2681 Unsteadiness on feet: Secondary | ICD-10-CM
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
## 2022-12-27 ENCOUNTER — Ambulatory Visit: Payer: Medicare HMO

## 2022-12-27 DIAGNOSIS — M6281 Muscle weakness (generalized): Secondary | ICD-10-CM

## 2022-12-27 DIAGNOSIS — R2681 Unsteadiness on feet: Secondary | ICD-10-CM

## 2023-01-03 ENCOUNTER — Ambulatory Visit: Payer: Medicare HMO

## 2023-01-03 DIAGNOSIS — R262 Difficulty in walking, not elsewhere classified: Secondary | ICD-10-CM

## 2023-01-03 DIAGNOSIS — R2681 Unsteadiness on feet: Secondary | ICD-10-CM

## 2023-01-03 DIAGNOSIS — M6281 Muscle weakness (generalized): Secondary | ICD-10-CM

## 2023-01-10 ENCOUNTER — Ambulatory Visit: Payer: Medicare HMO | Attending: Neurology

## 2023-01-10 DIAGNOSIS — R2681 Unsteadiness on feet: Secondary | ICD-10-CM | POA: Insufficient documentation

## 2023-01-10 DIAGNOSIS — R262 Difficulty in walking, not elsewhere classified: Secondary | ICD-10-CM | POA: Diagnosis present

## 2023-01-10 DIAGNOSIS — M6281 Muscle weakness (generalized): Secondary | ICD-10-CM | POA: Insufficient documentation
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
## 2023-01-11 ENCOUNTER — Other Ambulatory Visit: Payer: Self-pay | Admitting: Family Medicine

## 2023-01-11 DIAGNOSIS — Z1231 Encounter for screening mammogram for malignant neoplasm of breast: Secondary | ICD-10-CM

## 2023-01-17 ENCOUNTER — Ambulatory Visit
Admission: RE | Admit: 2023-01-17 | Discharge: 2023-01-17 | Disposition: A | Discharge status: Home / Self Care | Payer: Self-pay | Source: Ambulatory Visit | Attending: Acute Care | Admitting: Acute Care

## 2023-01-17 ENCOUNTER — Other Ambulatory Visit: Payer: Self-pay | Admitting: *Deleted

## 2023-01-17 ENCOUNTER — Ambulatory Visit: Payer: Medicare HMO

## 2023-01-17 DIAGNOSIS — M6281 Muscle weakness (generalized): Secondary | ICD-10-CM | POA: Diagnosis not present

## 2023-01-17 DIAGNOSIS — Z1231 Encounter for screening mammogram for malignant neoplasm of breast: Secondary | ICD-10-CM

## 2023-01-17 DIAGNOSIS — R2681 Unsteadiness on feet: Secondary | ICD-10-CM
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
## 2023-01-24 ENCOUNTER — Ambulatory Visit: Payer: Medicare HMO

## 2023-01-24 DIAGNOSIS — R262 Difficulty in walking, not elsewhere classified: Secondary | ICD-10-CM

## 2023-01-24 DIAGNOSIS — R2681 Unsteadiness on feet: Secondary | ICD-10-CM

## 2023-01-24 DIAGNOSIS — M6281 Muscle weakness (generalized): Secondary | ICD-10-CM
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
## 2023-02-07 ENCOUNTER — Ambulatory Visit: Payer: Medicare HMO

## 2023-02-07 DIAGNOSIS — R2681 Unsteadiness on feet: Secondary | ICD-10-CM

## 2023-02-07 DIAGNOSIS — M6281 Muscle weakness (generalized): Secondary | ICD-10-CM

## 2023-02-08 ENCOUNTER — Ambulatory Visit: Payer: Medicare HMO | Attending: Neurology

## 2023-02-08 DIAGNOSIS — R2681 Unsteadiness on feet: Secondary | ICD-10-CM | POA: Diagnosis present

## 2023-02-08 DIAGNOSIS — M6281 Muscle weakness (generalized): Secondary | ICD-10-CM | POA: Diagnosis present
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
## 2023-02-21 ENCOUNTER — Ambulatory Visit: Payer: Medicare HMO
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
## 2023-02-28 ENCOUNTER — Ambulatory Visit: Payer: Medicare HMO

## 2023-02-28 DIAGNOSIS — R2681 Unsteadiness on feet: Secondary | ICD-10-CM

## 2023-02-28 DIAGNOSIS — M6281 Muscle weakness (generalized): Secondary | ICD-10-CM

## 2023-03-07 ENCOUNTER — Ambulatory Visit: Payer: Medicare HMO | Attending: Neurology

## 2023-03-07 DIAGNOSIS — M6281 Muscle weakness (generalized): Secondary | ICD-10-CM | POA: Diagnosis present

## 2023-03-07 DIAGNOSIS — R2681 Unsteadiness on feet: Secondary | ICD-10-CM

## 2023-03-07 DIAGNOSIS — R262 Difficulty in walking, not elsewhere classified: Secondary | ICD-10-CM | POA: Diagnosis present
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
## 2023-03-21 ENCOUNTER — Ambulatory Visit: Payer: Medicare HMO

## 2023-03-23 ENCOUNTER — Ambulatory Visit: Payer: Medicare HMO

## 2023-03-23 DIAGNOSIS — M6281 Muscle weakness (generalized): Secondary | ICD-10-CM

## 2023-03-23 DIAGNOSIS — R2681 Unsteadiness on feet: Secondary | ICD-10-CM
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
## 2023-03-29 ENCOUNTER — Ambulatory Visit: Payer: Medicare HMO

## 2023-03-29 DIAGNOSIS — R262 Difficulty in walking, not elsewhere classified: Secondary | ICD-10-CM

## 2023-03-29 DIAGNOSIS — R2681 Unsteadiness on feet: Secondary | ICD-10-CM

## 2023-03-29 DIAGNOSIS — M6281 Muscle weakness (generalized): Secondary | ICD-10-CM | POA: Diagnosis not present
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
## 2023-04-05 ENCOUNTER — Ambulatory Visit: Payer: Medicare HMO | Attending: Neurology

## 2023-04-05 DIAGNOSIS — M6281 Muscle weakness (generalized): Secondary | ICD-10-CM | POA: Diagnosis present

## 2023-04-05 DIAGNOSIS — R262 Difficulty in walking, not elsewhere classified: Secondary | ICD-10-CM | POA: Diagnosis present

## 2023-04-05 DIAGNOSIS — R2681 Unsteadiness on feet: Secondary | ICD-10-CM | POA: Diagnosis present
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
## 2023-04-10 ENCOUNTER — Ambulatory Visit: Payer: Medicare HMO

## 2023-04-10 DIAGNOSIS — R262 Difficulty in walking, not elsewhere classified: Secondary | ICD-10-CM

## 2023-04-10 DIAGNOSIS — M6281 Muscle weakness (generalized): Secondary | ICD-10-CM
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
## 2023-04-17 ENCOUNTER — Ambulatory Visit: Payer: Medicare HMO

## 2023-04-17 DIAGNOSIS — M6281 Muscle weakness (generalized): Secondary | ICD-10-CM | POA: Diagnosis not present

## 2023-04-17 DIAGNOSIS — R262 Difficulty in walking, not elsewhere classified: Secondary | ICD-10-CM
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
## 2023-04-24 ENCOUNTER — Ambulatory Visit: Payer: Medicare HMO

## 2023-05-01 ENCOUNTER — Ambulatory Visit: Payer: Medicare HMO

## 2023-05-01 DIAGNOSIS — R262 Difficulty in walking, not elsewhere classified: Secondary | ICD-10-CM

## 2023-05-01 DIAGNOSIS — M6281 Muscle weakness (generalized): Secondary | ICD-10-CM
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
## 2023-05-23 ENCOUNTER — Ambulatory Visit: Payer: Medicare HMO

## 2023-05-25 ENCOUNTER — Ambulatory Visit: Payer: Medicare HMO | Attending: Neurology

## 2023-05-25 DIAGNOSIS — R262 Difficulty in walking, not elsewhere classified: Secondary | ICD-10-CM

## 2023-05-25 DIAGNOSIS — M6281 Muscle weakness (generalized): Secondary | ICD-10-CM
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
## 2023-06-05 ENCOUNTER — Ambulatory Visit: Payer: Medicare HMO | Attending: Neurology

## 2023-06-05 DIAGNOSIS — M6281 Muscle weakness (generalized): Secondary | ICD-10-CM | POA: Diagnosis present

## 2023-06-05 DIAGNOSIS — R262 Difficulty in walking, not elsewhere classified: Secondary | ICD-10-CM | POA: Diagnosis present
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
## 2023-06-12 ENCOUNTER — Ambulatory Visit: Payer: Medicare HMO

## 2023-06-12 DIAGNOSIS — M6281 Muscle weakness (generalized): Secondary | ICD-10-CM

## 2023-06-12 DIAGNOSIS — R262 Difficulty in walking, not elsewhere classified: Secondary | ICD-10-CM
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
## 2023-06-19 ENCOUNTER — Ambulatory Visit: Payer: Medicare HMO
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
## 2023-06-26 ENCOUNTER — Ambulatory Visit: Payer: Medicare HMO

## 2023-06-26 DIAGNOSIS — M6281 Muscle weakness (generalized): Secondary | ICD-10-CM

## 2023-06-26 DIAGNOSIS — R262 Difficulty in walking, not elsewhere classified: Secondary | ICD-10-CM
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
## 2023-07-07 ENCOUNTER — Ambulatory Visit: Payer: Medicare HMO | Attending: Neurology

## 2023-07-07 DIAGNOSIS — M6281 Muscle weakness (generalized): Secondary | ICD-10-CM | POA: Insufficient documentation

## 2023-07-07 DIAGNOSIS — R262 Difficulty in walking, not elsewhere classified: Secondary | ICD-10-CM | POA: Insufficient documentation

## 2023-07-13 ENCOUNTER — Ambulatory Visit: Payer: Medicare HMO

## 2023-07-13 DIAGNOSIS — M6281 Muscle weakness (generalized): Secondary | ICD-10-CM

## 2023-07-13 DIAGNOSIS — R262 Difficulty in walking, not elsewhere classified: Secondary | ICD-10-CM
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
## 2023-07-17 ENCOUNTER — Ambulatory Visit: Payer: Medicare HMO

## 2023-07-17 DIAGNOSIS — R262 Difficulty in walking, not elsewhere classified: Secondary | ICD-10-CM

## 2023-07-17 DIAGNOSIS — M6281 Muscle weakness (generalized): Secondary | ICD-10-CM

## 2023-07-27 ENCOUNTER — Ambulatory Visit: Payer: Medicare HMO
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
## 2023-07-28 ENCOUNTER — Ambulatory Visit: Payer: Medicare HMO

## 2023-07-28 DIAGNOSIS — M6281 Muscle weakness (generalized): Secondary | ICD-10-CM

## 2023-07-28 DIAGNOSIS — R262 Difficulty in walking, not elsewhere classified: Secondary | ICD-10-CM

## 2023-07-31 ENCOUNTER — Ambulatory Visit: Payer: Medicare HMO

## 2023-07-31 DIAGNOSIS — M6281 Muscle weakness (generalized): Secondary | ICD-10-CM

## 2023-07-31 DIAGNOSIS — R262 Difficulty in walking, not elsewhere classified: Secondary | ICD-10-CM
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
## 2023-08-03 ENCOUNTER — Ambulatory Visit: Payer: Medicare HMO | Attending: Neurology

## 2023-08-03 DIAGNOSIS — M6281 Muscle weakness (generalized): Secondary | ICD-10-CM | POA: Diagnosis present

## 2023-08-03 DIAGNOSIS — R262 Difficulty in walking, not elsewhere classified: Secondary | ICD-10-CM | POA: Insufficient documentation

## 2023-08-03 DIAGNOSIS — R2681 Unsteadiness on feet: Secondary | ICD-10-CM | POA: Insufficient documentation

## 2023-08-07 ENCOUNTER — Ambulatory Visit: Payer: Medicare HMO

## 2023-08-10 ENCOUNTER — Ambulatory Visit: Payer: Medicare HMO

## 2023-08-10 DIAGNOSIS — R2681 Unsteadiness on feet: Secondary | ICD-10-CM

## 2023-08-10 DIAGNOSIS — M6281 Muscle weakness (generalized): Secondary | ICD-10-CM

## 2023-08-10 DIAGNOSIS — R262 Difficulty in walking, not elsewhere classified: Secondary | ICD-10-CM
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
## 2023-08-14 ENCOUNTER — Ambulatory Visit: Payer: Medicare HMO

## 2023-08-14 DIAGNOSIS — R262 Difficulty in walking, not elsewhere classified: Secondary | ICD-10-CM

## 2023-08-14 DIAGNOSIS — M6281 Muscle weakness (generalized): Secondary | ICD-10-CM
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
## 2023-08-31 ENCOUNTER — Ambulatory Visit: Payer: Medicare HMO

## 2023-08-31 DIAGNOSIS — R2681 Unsteadiness on feet: Secondary | ICD-10-CM

## 2023-08-31 DIAGNOSIS — M6281 Muscle weakness (generalized): Secondary | ICD-10-CM

## 2023-08-31 DIAGNOSIS — R262 Difficulty in walking, not elsewhere classified: Secondary | ICD-10-CM
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
## 2023-09-07 ENCOUNTER — Ambulatory Visit: Payer: Medicare HMO | Attending: Neurology

## 2023-09-07 DIAGNOSIS — R2681 Unsteadiness on feet: Secondary | ICD-10-CM | POA: Diagnosis present

## 2023-09-07 DIAGNOSIS — R262 Difficulty in walking, not elsewhere classified: Secondary | ICD-10-CM | POA: Insufficient documentation

## 2023-09-07 DIAGNOSIS — M6281 Muscle weakness (generalized): Secondary | ICD-10-CM | POA: Insufficient documentation
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
## 2023-09-14 ENCOUNTER — Ambulatory Visit: Payer: Medicare HMO

## 2023-09-14 DIAGNOSIS — R2681 Unsteadiness on feet: Secondary | ICD-10-CM

## 2023-09-14 DIAGNOSIS — M6281 Muscle weakness (generalized): Secondary | ICD-10-CM

## 2023-09-14 DIAGNOSIS — R262 Difficulty in walking, not elsewhere classified: Secondary | ICD-10-CM
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
## 2023-09-26 ENCOUNTER — Ambulatory Visit: Payer: Medicare HMO

## 2023-09-26 DIAGNOSIS — M6281 Muscle weakness (generalized): Secondary | ICD-10-CM | POA: Diagnosis not present

## 2023-09-26 DIAGNOSIS — R2681 Unsteadiness on feet: Secondary | ICD-10-CM

## 2023-09-26 DIAGNOSIS — R262 Difficulty in walking, not elsewhere classified: Secondary | ICD-10-CM
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
## 2023-10-02 ENCOUNTER — Ambulatory Visit: Payer: Medicare HMO | Attending: Neurology

## 2023-10-02 DIAGNOSIS — R2681 Unsteadiness on feet: Secondary | ICD-10-CM | POA: Insufficient documentation

## 2023-10-02 DIAGNOSIS — R262 Difficulty in walking, not elsewhere classified: Secondary | ICD-10-CM | POA: Diagnosis present

## 2023-10-02 DIAGNOSIS — M6281 Muscle weakness (generalized): Secondary | ICD-10-CM | POA: Diagnosis present
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
## 2023-10-09 ENCOUNTER — Ambulatory Visit: Payer: Medicare HMO

## 2023-10-09 DIAGNOSIS — R262 Difficulty in walking, not elsewhere classified: Secondary | ICD-10-CM

## 2023-10-09 DIAGNOSIS — M6281 Muscle weakness (generalized): Secondary | ICD-10-CM

## 2023-10-16 ENCOUNTER — Ambulatory Visit: Payer: Medicare HMO

## 2023-10-16 DIAGNOSIS — R262 Difficulty in walking, not elsewhere classified: Secondary | ICD-10-CM | POA: Diagnosis not present

## 2023-10-16 DIAGNOSIS — R2681 Unsteadiness on feet: Secondary | ICD-10-CM

## 2023-10-16 DIAGNOSIS — M6281 Muscle weakness (generalized): Secondary | ICD-10-CM
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
## 2023-10-24 ENCOUNTER — Ambulatory Visit

## 2023-10-24 DIAGNOSIS — R262 Difficulty in walking, not elsewhere classified: Secondary | ICD-10-CM

## 2023-10-24 DIAGNOSIS — M6281 Muscle weakness (generalized): Secondary | ICD-10-CM

## 2023-10-25 ENCOUNTER — Ambulatory Visit
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
## 2023-10-31 ENCOUNTER — Ambulatory Visit: Attending: Neurology

## 2023-10-31 DIAGNOSIS — R262 Difficulty in walking, not elsewhere classified: Secondary | ICD-10-CM | POA: Insufficient documentation

## 2023-10-31 DIAGNOSIS — M6281 Muscle weakness (generalized): Secondary | ICD-10-CM | POA: Diagnosis present
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
## 2023-11-02 ENCOUNTER — Ambulatory Visit
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
## 2023-11-20 ENCOUNTER — Other Ambulatory Visit: Payer: Self-pay

## 2023-11-20 ENCOUNTER — Emergency Department

## 2023-11-20 ENCOUNTER — Emergency Department
Admission: EM | Admit: 2023-11-20 | Discharge: 2023-11-20 | Disposition: A | Discharge status: Home / Self Care | Attending: Emergency Medicine | Admitting: Emergency Medicine

## 2023-11-20 DIAGNOSIS — I10 Essential (primary) hypertension: Secondary | ICD-10-CM | POA: Insufficient documentation

## 2023-11-20 DIAGNOSIS — S20211A Contusion of right front wall of thorax, initial encounter: Secondary | ICD-10-CM | POA: Diagnosis not present

## 2023-11-20 DIAGNOSIS — E119 Type 2 diabetes mellitus without complications: Secondary | ICD-10-CM | POA: Diagnosis not present

## 2023-11-20 DIAGNOSIS — S299XXA Unspecified injury of thorax, initial encounter: Secondary | ICD-10-CM | POA: Diagnosis present

## 2023-11-20 DIAGNOSIS — S39012A Strain of muscle, fascia and tendon of lower back, initial encounter: Secondary | ICD-10-CM | POA: Insufficient documentation

## 2023-11-20 DIAGNOSIS — W19XXXA Unspecified fall, initial encounter: Secondary | ICD-10-CM

## 2023-11-20 DIAGNOSIS — W010XXA Fall on same level from slipping, tripping and stumbling without subsequent striking against object, initial encounter: Secondary | ICD-10-CM | POA: Insufficient documentation

## 2023-11-20 MED ORDER — MELOXICAM 15 MG PO TABS: 15.0000 mg | ORAL_TABLET | Freq: Every day | ORAL | 2 refills | Status: AC

## 2023-11-20 MED ORDER — BACLOFEN 10 MG PO TABS: 10.0000 mg | ORAL_TABLET | Freq: Three times a day (TID) | ORAL | 0 refills | Status: AC

## 2023-11-20 NOTE — ED Triage Notes (Signed)
 Pt comes with c/o fall. Pt states she tripped and fell last week. Pt states pain to back on right side. Pt states some pain under rib cage.
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
## 2023-11-21 ENCOUNTER — Ambulatory Visit

## 2023-11-21 DIAGNOSIS — M6281 Muscle weakness (generalized): Secondary | ICD-10-CM

## 2023-11-21 DIAGNOSIS — R262 Difficulty in walking, not elsewhere classified: Secondary | ICD-10-CM | POA: Diagnosis not present

## 2023-11-28 ENCOUNTER — Encounter

## 2023-12-01 ENCOUNTER — Ambulatory Visit: Attending: Neurology

## 2023-12-01 DIAGNOSIS — M6281 Muscle weakness (generalized): Secondary | ICD-10-CM | POA: Insufficient documentation

## 2023-12-01 DIAGNOSIS — R262 Difficulty in walking, not elsewhere classified: Secondary | ICD-10-CM | POA: Diagnosis present
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
## 2023-12-05 ENCOUNTER — Ambulatory Visit

## 2023-12-05 DIAGNOSIS — R262 Difficulty in walking, not elsewhere classified: Secondary | ICD-10-CM

## 2023-12-05 DIAGNOSIS — M6281 Muscle weakness (generalized): Secondary | ICD-10-CM
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
## 2023-12-12 ENCOUNTER — Ambulatory Visit

## 2023-12-12 DIAGNOSIS — M6281 Muscle weakness (generalized): Secondary | ICD-10-CM

## 2023-12-12 DIAGNOSIS — R262 Difficulty in walking, not elsewhere classified: Secondary | ICD-10-CM | POA: Diagnosis not present
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
## 2023-12-19 ENCOUNTER — Ambulatory Visit

## 2023-12-19 DIAGNOSIS — R262 Difficulty in walking, not elsewhere classified: Secondary | ICD-10-CM | POA: Diagnosis not present

## 2023-12-19 DIAGNOSIS — M6281 Muscle weakness (generalized): Secondary | ICD-10-CM
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
## 2023-12-26 ENCOUNTER — Ambulatory Visit

## 2023-12-26 DIAGNOSIS — R262 Difficulty in walking, not elsewhere classified: Secondary | ICD-10-CM

## 2023-12-26 DIAGNOSIS — M6281 Muscle weakness (generalized): Secondary | ICD-10-CM
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
## 2024-01-05 ENCOUNTER — Ambulatory Visit: Attending: Neurology

## 2024-01-05 DIAGNOSIS — M6281 Muscle weakness (generalized): Secondary | ICD-10-CM | POA: Insufficient documentation

## 2024-01-05 DIAGNOSIS — R262 Difficulty in walking, not elsewhere classified: Secondary | ICD-10-CM | POA: Insufficient documentation

## 2024-01-05 DIAGNOSIS — R2681 Unsteadiness on feet: Secondary | ICD-10-CM | POA: Diagnosis present

## 2024-01-12 ENCOUNTER — Ambulatory Visit

## 2024-01-12 DIAGNOSIS — R262 Difficulty in walking, not elsewhere classified: Secondary | ICD-10-CM | POA: Diagnosis not present

## 2024-01-12 DIAGNOSIS — R2681 Unsteadiness on feet: Secondary | ICD-10-CM

## 2024-01-12 DIAGNOSIS — M6281 Muscle weakness (generalized): Secondary | ICD-10-CM
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
## 2024-01-17 ENCOUNTER — Other Ambulatory Visit: Payer: Self-pay | Admitting: Family Medicine

## 2024-01-17 DIAGNOSIS — Z1231 Encounter for screening mammogram for malignant neoplasm of breast: Secondary | ICD-10-CM

## 2024-01-19 ENCOUNTER — Ambulatory Visit

## 2024-01-19 DIAGNOSIS — R2681 Unsteadiness on feet: Secondary | ICD-10-CM

## 2024-01-19 DIAGNOSIS — R262 Difficulty in walking, not elsewhere classified: Secondary | ICD-10-CM

## 2024-01-19 DIAGNOSIS — M6281 Muscle weakness (generalized): Secondary | ICD-10-CM
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
## 2024-01-22 ENCOUNTER — Encounter

## 2024-01-24 ENCOUNTER — Ambulatory Visit

## 2024-01-24 DIAGNOSIS — R2681 Unsteadiness on feet: Secondary | ICD-10-CM

## 2024-01-24 DIAGNOSIS — R262 Difficulty in walking, not elsewhere classified: Secondary | ICD-10-CM | POA: Diagnosis not present

## 2024-01-24 DIAGNOSIS — M6281 Muscle weakness (generalized): Secondary | ICD-10-CM
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
## 2024-01-26 ENCOUNTER — Ambulatory Visit

## 2024-01-29 ENCOUNTER — Encounter

## 2024-01-31 ENCOUNTER — Ambulatory Visit

## 2024-02-05 ENCOUNTER — Encounter

## 2024-02-14 ENCOUNTER — Ambulatory Visit: Attending: Neurology

## 2024-02-14 DIAGNOSIS — R2681 Unsteadiness on feet: Secondary | ICD-10-CM | POA: Diagnosis present

## 2024-02-14 DIAGNOSIS — R262 Difficulty in walking, not elsewhere classified: Secondary | ICD-10-CM | POA: Diagnosis present

## 2024-02-14 DIAGNOSIS — M6281 Muscle weakness (generalized): Secondary | ICD-10-CM | POA: Insufficient documentation
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
## 2024-02-16 ENCOUNTER — Encounter

## 2024-02-21 ENCOUNTER — Encounter

## 2024-02-23 ENCOUNTER — Encounter
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
## 2024-02-28 ENCOUNTER — Ambulatory Visit

## 2024-02-28 DIAGNOSIS — R2681 Unsteadiness on feet: Secondary | ICD-10-CM

## 2024-02-28 DIAGNOSIS — M6281 Muscle weakness (generalized): Secondary | ICD-10-CM

## 2024-03-01 ENCOUNTER — Encounter
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
## 2024-03-06 ENCOUNTER — Ambulatory Visit: Attending: Neurology

## 2024-03-06 DIAGNOSIS — M6281 Muscle weakness (generalized): Secondary | ICD-10-CM | POA: Diagnosis present

## 2024-03-06 DIAGNOSIS — R262 Difficulty in walking, not elsewhere classified: Secondary | ICD-10-CM | POA: Insufficient documentation

## 2024-03-06 DIAGNOSIS — R2681 Unsteadiness on feet: Secondary | ICD-10-CM | POA: Insufficient documentation

## 2024-03-08 ENCOUNTER — Encounter
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
## 2024-03-19 ENCOUNTER — Ambulatory Visit

## 2024-03-19 DIAGNOSIS — R2681 Unsteadiness on feet: Secondary | ICD-10-CM | POA: Diagnosis not present

## 2024-03-19 DIAGNOSIS — M6281 Muscle weakness (generalized): Secondary | ICD-10-CM

## 2024-03-19 DIAGNOSIS — R262 Difficulty in walking, not elsewhere classified: Secondary | ICD-10-CM

## 2024-04-02 ENCOUNTER — Ambulatory Visit: Attending: Neurology

## 2024-04-02 DIAGNOSIS — R2681 Unsteadiness on feet: Secondary | ICD-10-CM | POA: Insufficient documentation

## 2024-04-02 DIAGNOSIS — M6281 Muscle weakness (generalized): Secondary | ICD-10-CM | POA: Insufficient documentation

## 2024-04-02 DIAGNOSIS — R262 Difficulty in walking, not elsewhere classified: Secondary | ICD-10-CM | POA: Insufficient documentation
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
## 2024-04-09 ENCOUNTER — Ambulatory Visit

## 2024-04-09 DIAGNOSIS — R2681 Unsteadiness on feet: Secondary | ICD-10-CM

## 2024-04-09 DIAGNOSIS — M6281 Muscle weakness (generalized): Secondary | ICD-10-CM

## 2024-04-09 DIAGNOSIS — R262 Difficulty in walking, not elsewhere classified: Secondary | ICD-10-CM
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
## 2024-04-16 ENCOUNTER — Ambulatory Visit

## 2024-04-16 DIAGNOSIS — R262 Difficulty in walking, not elsewhere classified: Secondary | ICD-10-CM

## 2024-04-16 DIAGNOSIS — R2681 Unsteadiness on feet: Secondary | ICD-10-CM | POA: Diagnosis not present

## 2024-04-16 DIAGNOSIS — M6281 Muscle weakness (generalized): Secondary | ICD-10-CM
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
## 2024-04-23 ENCOUNTER — Ambulatory Visit

## 2024-04-23 DIAGNOSIS — M6281 Muscle weakness (generalized): Secondary | ICD-10-CM

## 2024-04-23 DIAGNOSIS — R2681 Unsteadiness on feet: Secondary | ICD-10-CM

## 2024-04-23 DIAGNOSIS — R262 Difficulty in walking, not elsewhere classified: Secondary | ICD-10-CM
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
## 2024-04-30 ENCOUNTER — Ambulatory Visit

## 2024-04-30 DIAGNOSIS — R2681 Unsteadiness on feet: Secondary | ICD-10-CM | POA: Diagnosis not present

## 2024-04-30 DIAGNOSIS — M6281 Muscle weakness (generalized): Secondary | ICD-10-CM
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
## 2024-05-07 ENCOUNTER — Ambulatory Visit: Attending: Neurology

## 2024-05-07 DIAGNOSIS — R2681 Unsteadiness on feet: Secondary | ICD-10-CM | POA: Diagnosis present

## 2024-05-07 DIAGNOSIS — M6281 Muscle weakness (generalized): Secondary | ICD-10-CM | POA: Insufficient documentation

## 2024-05-07 DIAGNOSIS — R262 Difficulty in walking, not elsewhere classified: Secondary | ICD-10-CM | POA: Insufficient documentation
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
## 2024-05-14 ENCOUNTER — Encounter

## 2024-05-15 ENCOUNTER — Encounter

## 2024-05-21 ENCOUNTER — Ambulatory Visit

## 2024-05-21 DIAGNOSIS — M6281 Muscle weakness (generalized): Secondary | ICD-10-CM

## 2024-05-21 DIAGNOSIS — R2681 Unsteadiness on feet: Secondary | ICD-10-CM

## 2024-05-21 DIAGNOSIS — R262 Difficulty in walking, not elsewhere classified: Secondary | ICD-10-CM
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
## 2024-05-28 ENCOUNTER — Ambulatory Visit
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
## 2024-06-04 ENCOUNTER — Ambulatory Visit: Attending: Neurology

## 2024-06-04 DIAGNOSIS — R2681 Unsteadiness on feet: Secondary | ICD-10-CM | POA: Insufficient documentation

## 2024-06-04 DIAGNOSIS — M6281 Muscle weakness (generalized): Secondary | ICD-10-CM | POA: Diagnosis present

## 2024-06-04 DIAGNOSIS — R262 Difficulty in walking, not elsewhere classified: Secondary | ICD-10-CM | POA: Insufficient documentation
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
## 2024-06-11 ENCOUNTER — Ambulatory Visit

## 2024-06-11 DIAGNOSIS — M6281 Muscle weakness (generalized): Secondary | ICD-10-CM | POA: Diagnosis not present

## 2024-06-11 DIAGNOSIS — R2681 Unsteadiness on feet: Secondary | ICD-10-CM

## 2024-06-11 DIAGNOSIS — R262 Difficulty in walking, not elsewhere classified: Secondary | ICD-10-CM
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
## 2024-06-18 ENCOUNTER — Ambulatory Visit

## 2024-06-25 ENCOUNTER — Ambulatory Visit

## 2024-06-25 DIAGNOSIS — M6281 Muscle weakness (generalized): Secondary | ICD-10-CM

## 2024-06-25 DIAGNOSIS — R262 Difficulty in walking, not elsewhere classified: Secondary | ICD-10-CM

## 2024-06-25 DIAGNOSIS — R2681 Unsteadiness on feet: Secondary | ICD-10-CM
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
## 2024-07-01 ENCOUNTER — Ambulatory Visit: Attending: Neurology

## 2024-07-01 DIAGNOSIS — M6281 Muscle weakness (generalized): Secondary | ICD-10-CM | POA: Insufficient documentation

## 2024-07-01 DIAGNOSIS — R2681 Unsteadiness on feet: Secondary | ICD-10-CM | POA: Insufficient documentation

## 2024-07-08 ENCOUNTER — Ambulatory Visit

## 2024-07-15 ENCOUNTER — Ambulatory Visit

## 2024-07-18 ENCOUNTER — Ambulatory Visit

## 2024-07-18 DIAGNOSIS — M6281 Muscle weakness (generalized): Secondary | ICD-10-CM

## 2024-07-18 DIAGNOSIS — R2681 Unsteadiness on feet: Secondary | ICD-10-CM
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
## 2024-07-30 ENCOUNTER — Ambulatory Visit

## 2024-07-30 DIAGNOSIS — M6281 Muscle weakness (generalized): Secondary | ICD-10-CM | POA: Diagnosis not present

## 2024-07-30 DIAGNOSIS — R2681 Unsteadiness on feet: Secondary | ICD-10-CM
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
## 2024-08-05 ENCOUNTER — Ambulatory Visit

## 2024-08-05 DIAGNOSIS — M6281 Muscle weakness (generalized): Secondary | ICD-10-CM

## 2024-08-05 DIAGNOSIS — R2681 Unsteadiness on feet: Secondary | ICD-10-CM
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
## 2024-08-07 ENCOUNTER — Ambulatory Visit: Attending: Neurology

## 2024-08-07 DIAGNOSIS — R262 Difficulty in walking, not elsewhere classified: Secondary | ICD-10-CM | POA: Diagnosis present

## 2024-08-07 DIAGNOSIS — R2681 Unsteadiness on feet: Secondary | ICD-10-CM | POA: Insufficient documentation

## 2024-08-07 DIAGNOSIS — M6281 Muscle weakness (generalized): Secondary | ICD-10-CM | POA: Insufficient documentation

## 2024-08-13 ENCOUNTER — Ambulatory Visit
